# Patient Record
Sex: Female | Born: 1935 | Race: White | Hispanic: No | Smoking: Never smoker
Health system: Southern US, Community
[De-identification: ages and names within clinical notes are randomized; demographics above are authoritative.]

## PROBLEM LIST (undated history)

## (undated) DIAGNOSIS — C569 Malignant neoplasm of unspecified ovary: Secondary | ICD-10-CM

## (undated) DIAGNOSIS — K219 Gastro-esophageal reflux disease without esophagitis: Secondary | ICD-10-CM

## (undated) DIAGNOSIS — M1990 Unspecified osteoarthritis, unspecified site: Secondary | ICD-10-CM

## (undated) DIAGNOSIS — C189 Malignant neoplasm of colon, unspecified: Secondary | ICD-10-CM

## (undated) DIAGNOSIS — M797 Fibromyalgia: Secondary | ICD-10-CM

## (undated) DIAGNOSIS — I10 Essential (primary) hypertension: Secondary | ICD-10-CM

## (undated) DIAGNOSIS — F419 Anxiety disorder, unspecified: Secondary | ICD-10-CM

## (undated) DIAGNOSIS — F0390 Unspecified dementia without behavioral disturbance: Secondary | ICD-10-CM

## (undated) HISTORY — PX: COLON SURGERY: SHX602

## (undated) HISTORY — PX: ABDOMINAL HYSTERECTOMY: SHX81

---

## 1998-12-16 ENCOUNTER — Other Ambulatory Visit: Admission: RE | Admit: 1998-12-16 | Discharge: 1998-12-16 | Payer: Self-pay | Admitting: *Deleted

## 1998-12-19 ENCOUNTER — Ambulatory Visit (HOSPITAL_COMMUNITY): Admission: RE | Admit: 1998-12-19 | Discharge: 1998-12-19 | Payer: Self-pay | Admitting: *Deleted

## 2000-06-24 ENCOUNTER — Encounter: Payer: Self-pay | Admitting: *Deleted

## 2000-06-24 ENCOUNTER — Encounter: Admission: RE | Admit: 2000-06-24 | Discharge: 2000-06-24 | Payer: Self-pay | Admitting: *Deleted

## 2000-12-20 ENCOUNTER — Ambulatory Visit (HOSPITAL_COMMUNITY): Admission: RE | Admit: 2000-12-20 | Discharge: 2000-12-20 | Payer: Self-pay | Admitting: Gastroenterology

## 2001-02-09 ENCOUNTER — Emergency Department (HOSPITAL_COMMUNITY): Admission: EM | Admit: 2001-02-09 | Discharge: 2001-02-09 | Payer: Self-pay | Admitting: Emergency Medicine

## 2001-02-17 ENCOUNTER — Encounter: Admission: RE | Admit: 2001-02-17 | Discharge: 2001-02-17 | Payer: Self-pay | Admitting: Internal Medicine

## 2001-02-17 ENCOUNTER — Encounter: Payer: Self-pay | Admitting: Internal Medicine

## 2002-04-01 ENCOUNTER — Encounter: Admission: RE | Admit: 2002-04-01 | Discharge: 2002-04-01 | Payer: Self-pay | Admitting: Internal Medicine

## 2002-04-01 ENCOUNTER — Encounter: Payer: Self-pay | Admitting: Internal Medicine

## 2002-11-13 ENCOUNTER — Encounter: Payer: Self-pay | Admitting: Internal Medicine

## 2002-11-13 ENCOUNTER — Encounter: Admission: RE | Admit: 2002-11-13 | Discharge: 2002-11-13 | Payer: Self-pay | Admitting: Internal Medicine

## 2003-03-24 ENCOUNTER — Encounter: Admission: RE | Admit: 2003-03-24 | Discharge: 2003-03-24 | Payer: Self-pay | Admitting: Family Medicine

## 2003-03-24 ENCOUNTER — Encounter: Payer: Self-pay | Admitting: Family Medicine

## 2003-04-07 ENCOUNTER — Encounter: Payer: Self-pay | Admitting: Family Medicine

## 2003-04-07 ENCOUNTER — Encounter: Admission: RE | Admit: 2003-04-07 | Discharge: 2003-04-07 | Payer: Self-pay | Admitting: Family Medicine

## 2004-03-26 ENCOUNTER — Emergency Department (HOSPITAL_COMMUNITY): Admission: EM | Admit: 2004-03-26 | Discharge: 2004-03-27 | Payer: Self-pay | Admitting: Emergency Medicine

## 2004-04-19 ENCOUNTER — Ambulatory Visit (HOSPITAL_COMMUNITY): Admission: RE | Admit: 2004-04-19 | Discharge: 2004-04-19 | Payer: Self-pay | Admitting: Internal Medicine

## 2005-01-24 ENCOUNTER — Encounter: Admission: RE | Admit: 2005-01-24 | Discharge: 2005-01-24 | Payer: Self-pay | Admitting: Internal Medicine

## 2006-05-15 ENCOUNTER — Encounter: Admission: RE | Admit: 2006-05-15 | Discharge: 2006-05-15 | Payer: Self-pay | Admitting: Internal Medicine

## 2006-06-24 ENCOUNTER — Encounter: Admission: RE | Admit: 2006-06-24 | Discharge: 2006-06-24 | Payer: Self-pay | Admitting: Internal Medicine

## 2006-07-17 ENCOUNTER — Encounter: Admission: RE | Admit: 2006-07-17 | Discharge: 2006-07-17 | Payer: Self-pay | Admitting: Internal Medicine

## 2006-08-16 ENCOUNTER — Inpatient Hospital Stay (HOSPITAL_COMMUNITY): Admission: EM | Admit: 2006-08-16 | Discharge: 2006-08-20 | Payer: Self-pay | Admitting: Family Medicine

## 2006-08-19 ENCOUNTER — Encounter: Payer: Self-pay | Admitting: Vascular Surgery

## 2006-12-12 ENCOUNTER — Encounter: Admission: RE | Admit: 2006-12-12 | Discharge: 2006-12-12 | Payer: Self-pay | Admitting: Internal Medicine

## 2007-06-02 ENCOUNTER — Encounter: Admission: RE | Admit: 2007-06-02 | Discharge: 2007-06-02 | Payer: Self-pay | Admitting: Sports Medicine

## 2007-08-12 ENCOUNTER — Encounter: Admission: RE | Admit: 2007-08-12 | Discharge: 2007-08-12 | Payer: Self-pay | Admitting: Sports Medicine

## 2007-11-24 ENCOUNTER — Encounter: Admission: RE | Admit: 2007-11-24 | Discharge: 2007-11-24 | Payer: Self-pay | Admitting: Sports Medicine

## 2008-02-03 ENCOUNTER — Encounter: Admission: RE | Admit: 2008-02-03 | Discharge: 2008-02-03 | Payer: Self-pay | Admitting: Sports Medicine

## 2008-05-07 ENCOUNTER — Encounter: Admission: RE | Admit: 2008-05-07 | Discharge: 2008-05-07 | Payer: Self-pay | Admitting: Sports Medicine

## 2011-01-02 ENCOUNTER — Encounter: Admission: RE | Admit: 2011-01-02 | Payer: Self-pay | Source: Home / Self Care | Admitting: Internal Medicine

## 2011-05-18 NOTE — H&P (Signed)
NAME:  Beth, Garner NO.:  [id number]   MEDICAL RECORD NO.:  [id number]          Garner TYPE:  INP   LOCATION:  05-23-15                         FACILITY:  [facility]   PHYSICIAN:  Candyce Churn, M.D.DATE OF BIRTH:  06/08/1918   DATE OF ADMISSION:  08/16/2006  DATE OF DISCHARGE:                                HISTORY & PHYSICAL   CHIEF COMPLAINT:  Left chest pain.   HISTORY OF PRESENT ILLNESS:  [name] is a very pleasant 77 year old  female who developed left pleuritic chest pain last p.m. rather suddenly.  This persisted on inspiration this a.m. and she was seen at a medical walk-  in clinic where she was sent for a CT scan of her chest to evaluate for  pulmonary embolism and she indeed had 2 areas to suggest pulmonary emboli on  chest CT.   She has had a prior pulmonary embolism in Beth distant past and is being  admitted now for intravenous therapy, heparin, observation and monitoring,  and Coumadin therapy.   She has not been eating well over Beth past 24 hours secondary to Beth pain.   ALLERGIES:  NOVOCAIN. CODEINE also causes nausea. CIPROFLOXACIN has caused  nausea and vomiting as well.   PAST MEDICAL HISTORY:  1. Peptic ulcer disease distant past.  2. Asthma.  3. Hypertension.  4. Peripheral neuropathy.  5. History of colon cancer.  6. History of ovarian cancer.  7. Degenerative joint disease.  8. Cholelithiasis.  9. Pulmonary fibrosis.  10.GERD.  11.Hiatal hernia.  12.Osteoarthritis.   CURRENT MEDICATIONS:  1. Spiriva inhaler 1 capsule inhaled daily.  2. Lisinopril HCT 10/12.5 1 tablet daily.  3. Tylenol 650 mg p.o. q.4 h p.r.n.  4. Multivitamin 1 daily.  5. Pepcid or Zantac b.i.d. p.r.n.   PAST SURGICAL HISTORY:  1. Right hemicolectomy in 1983/05/23.  2. Left oophorectomy.  3. TAH/BSO and right oophorectomy at a different time.   FAMILY HISTORY:  Father died of lung cancer in 05/22/73, mother died of rectal  cancer. Brother possibly died of  colon cancer. She has an adopted daughter  who is healthy.   SOCIAL HISTORY:  [name] is a widow and lives alone in [place]. She  is retired from working in [employer] as well as working at [employer]. No alcohol or tobacco use. Does drink coffee.   REVIEW OF SYSTEMS:  Feeling well until yesterday as per HPI. No recent  changes in bowel habits or urinary habits.   PHYSICAL EXAMINATION:  GENERAL:  Elderly female complaining of left  pleuritic chest pain. Alert and oriented, very pleasant and cooperative.  VITAL SIGNS:  Temperature 98.3, pulse 88 and regular, respiratory rate 20  and shallow secondary to left pleuritic chest pain. Blood pressure 98/52, O2  saturation on room air is 96% but this may have actually been with some  oxygen per nasal cannula.  HEENT:  Benign.  NECK:  Supple without JVD or thyromegaly.  CHEST:  Clear to auscultation.  CARDIAC:  Reveals a regular rhythm with no murmurs.  ABDOMEN:  Soft, nontender, no  obvious hepatomegaly, bowel sounds are normal.  PELVIC:  Not performed.  EXTREMITIES:  Without cyanosis, clubbing or edema. Does have some  deformities of her toes. Good capillary refill distally.  NEUROLOGIC:  Nonfocal.  SKIN:  Warm and dry.   [name] did bring a CD of her chest CT from [facility] but I  could not open it on Beth monitor in Beth emergency room.   ASSESSMENT:  An 77 year old female with pulmonary embolus - she has had one  in Beth very distant past by history.   Will admit and monitor, start Lovenox 1 mg/kg q.12 h and also start  Coumadin, first dose of 10 mg tonight and then as per pharmacy dosing over  Beth next several days.   [name] would like to go home if at all possible from this  hospitalization and may do well with home nursing until Coumadin is  therapeutic.   Will treat with nasal cannula O2 and monitor until it is found that she is  clinically stable.   I should mention that her  laboratories reveal a hemoglobin of 11.6, platelet  count 191,000 with 73% neutrophils. Protime was 14.6 seconds, PTT is 27  seconds, INR is 1.1. Electrolytes revealed a sodium of 137, potassium 4.1,  chloride 104, bicarb 26, glucose 109, BUN 19, creatinine 1.1, calcium is  9.5. Creatinine kinase is 44, CK-MB is 1.1 and troponin is 0.02. Urinalysis  reveals a specific gravity as high as 1.046, pH is 6, urine and glucose is  negative and other parameters are negative for abnormality on urinalysis.   EKG is pending and chest x-ray reveals left base atelectasis or infiltrate  quite possibly consistent with pulmonary infarct.   Will also add Toradol 30 mg IV q.6-8 h as needed for left chest pain because  of a possible pulmonary infarct.   I should also mention Beth chest x-ray revealed underlying emphysema was  suspected with borderline cardiomegaly.      Candyce Churn, M.D.  Electronically Signed     RNG/MEDQ  D:  08/16/2006  T:  08/16/2006  Job:  [id number]   cc:   Georgann Housekeeper, MD

## 2011-05-18 NOTE — Discharge Summary (Signed)
[name], SAULTER                ACCOUNT NO.:  [id number]   MEDICAL RECORD NO.:  [id number]          PATIENT TYPE:  INP   LOCATION:  2016                         FACILITY:  [facility]   PHYSICIAN:  Georgann Housekeeper, MD      DATE OF BIRTH:  May 15, 1918   DATE OF ADMISSION:  08/16/2006  DATE OF DISCHARGE:  08/20/2006                                 DISCHARGE SUMMARY   DISCHARGE DIAGNOSES:  1. Acute pulmonary embolism.  2. Anemia.  3. Mild renal insufficiency, resolved.  4. History of hypertension,  5. History of asthma and mild pulmonary fibrosis.  6. History of colon cancer.   MEDICATIONS ON DISCHARGE:  1. Spiriva inhaler once a day.  2. Coumadin 2 mg as directed.  3. Pepcid 20 mg q.day p.r.n.  4. Lisinopril, hydrochlorothiazide 10/12.5 (on hold).  Diet Coumadin diet.      No NSAIDs or aspirin use.  5. Tylenol p.r.n. for pain.   PROCEDURES:  CT of the chest showed a small PE on the left lung lobe with  some left pleuritic pain noted.  Doppler of the lower extremity negative for  DVT.  As far as the EKG, normal sinus rhythm.   LAB DATA:  Significant on admission.  The creatinine was 1.7.  Normal white  count.  Normal electrolytes and hemoglobin of 11.0.   HOSPITAL COURSE:  1. The patient is an 77 year old female with a history of asthma, mild      pulmonary fibrosis, hypertension, colon CA, degenerative joint disease,      in usual state of health, started having left pleuritic chest pain      suddenly on inspiration.  Went to the walk-in clinic.  Had suspicious      for PE.  Was sent for a CT scan, which showed a pulmonary embolism on      the left and also a very small one on the right.  Hemodynamically,      patient was stable.  Patient was admitted with anticoagulation with      Lovenox subcu, as well as started on Coumadin.  For pleuritic chest      pain, the patient had Toradol with good results of the pain control.      Required for only 24 hours.  Her oxygen remained stable  in saturations      between 93% to 96% on room air.  As far as her Coumadin level, she      received subsequently ten 5 mg of Coumadin.  INR went from 1.2 to 2.3      and subsequently 3.6.  Coumadin was given 1 mg.  INR remained at 3.1.      At the time of discharge, Lovenox was discontinued.  No evidence of any      bleeding.  2. History of mild anemia.  Hemoglobin dropped from 11.0 to 9.4, which the      last 3 days remained stable on the hemoglobin of 9.0.  No evidence of      any bleeding.  3. Hypertension.  Patient had low blood pressures in  the systolics of 100.      Lisinopril, hydrochlorothiazide was held.  Will continue monitoring      that outpatient.  To restart back.  4. As far as renal insufficiency, creatinine 1.7, more mild dehydration      resolved.  Creatinine went down to 1.3 at      time of discharge.  Electrolytes remain normal, along with potassium      and her sodium.  5. As far as the Doppler of the lower extremity, negative for DVT.  At the      time of discharge, INR was 3.1.  We will adjust that in the office.  I      will see her back in one week.  She already has an appointment.      Georgann Housekeeper, MD  Electronically Signed     KH/MEDQ  D:  08/20/2006  T:  08/20/2006  Job:  [id number]

## 2011-06-27 ENCOUNTER — Inpatient Hospital Stay (HOSPITAL_COMMUNITY)
Admission: EM | Admit: 2011-06-27 | Discharge: 2011-06-30 | DRG: 149 | Disposition: A | Discharge status: Skilled Nursing Facility | Payer: Medicare Other | Attending: Internal Medicine | Admitting: Internal Medicine

## 2011-06-27 ENCOUNTER — Emergency Department (HOSPITAL_COMMUNITY): Payer: Medicare Other

## 2011-06-27 DIAGNOSIS — R627 Adult failure to thrive: Secondary | ICD-10-CM | POA: Diagnosis present

## 2011-06-27 DIAGNOSIS — G609 Hereditary and idiopathic neuropathy, unspecified: Secondary | ICD-10-CM | POA: Diagnosis present

## 2011-06-27 DIAGNOSIS — K219 Gastro-esophageal reflux disease without esophagitis: Secondary | ICD-10-CM | POA: Diagnosis present

## 2011-06-27 DIAGNOSIS — E869 Volume depletion, unspecified: Secondary | ICD-10-CM | POA: Diagnosis present

## 2011-06-27 DIAGNOSIS — F411 Generalized anxiety disorder: Secondary | ICD-10-CM | POA: Diagnosis present

## 2011-06-27 DIAGNOSIS — Z86718 Personal history of other venous thrombosis and embolism: Secondary | ICD-10-CM

## 2011-06-27 DIAGNOSIS — I129 Hypertensive chronic kidney disease with stage 1 through stage 4 chronic kidney disease, or unspecified chronic kidney disease: Secondary | ICD-10-CM | POA: Diagnosis present

## 2011-06-27 DIAGNOSIS — N183 Chronic kidney disease, stage 3 unspecified: Secondary | ICD-10-CM | POA: Diagnosis present

## 2011-06-27 DIAGNOSIS — M1990 Unspecified osteoarthritis, unspecified site: Secondary | ICD-10-CM | POA: Diagnosis present

## 2011-06-27 DIAGNOSIS — J8410 Pulmonary fibrosis, unspecified: Secondary | ICD-10-CM | POA: Diagnosis present

## 2011-06-27 DIAGNOSIS — Z79899 Other long term (current) drug therapy: Secondary | ICD-10-CM

## 2011-06-27 DIAGNOSIS — R269 Unspecified abnormalities of gait and mobility: Secondary | ICD-10-CM | POA: Diagnosis present

## 2011-06-27 DIAGNOSIS — D638 Anemia in other chronic diseases classified elsewhere: Secondary | ICD-10-CM | POA: Diagnosis present

## 2011-06-27 DIAGNOSIS — Z8543 Personal history of malignant neoplasm of ovary: Secondary | ICD-10-CM

## 2011-06-27 DIAGNOSIS — H8110 Benign paroxysmal vertigo, unspecified ear: Principal | ICD-10-CM | POA: Diagnosis present

## 2011-06-27 LAB — URINALYSIS, ROUTINE W REFLEX MICROSCOPIC
Bilirubin Urine: NEGATIVE
Ketones, ur: NEGATIVE mg/dL
Nitrite: NEGATIVE
pH: 8 (ref 5.0–8.0)

## 2011-06-27 LAB — LIPASE, BLOOD: Lipase: 55 U/L (ref 11–59)

## 2011-06-27 LAB — CBC
HCT: 36.6 % (ref 36.0–46.0)
Hemoglobin: 11.6 g/dL — ABNORMAL LOW (ref 12.0–15.0)
RBC: 4 MIL/uL (ref 3.87–5.11)
WBC: 4.7 10*3/uL (ref 4.0–10.5)

## 2011-06-27 LAB — DIFFERENTIAL
Basophils Absolute: 0 10*3/uL (ref 0.0–0.1)
Lymphocytes Relative: 24 % (ref 12–46)
Neutro Abs: 3.4 10*3/uL (ref 1.7–7.7)

## 2011-06-27 LAB — CK TOTAL AND CKMB (NOT AT ARMC)
CK, MB: 3.8 ng/mL (ref 0.3–4.0)
Relative Index: INVALID (ref 0.0–2.5)
Total CK: 77 U/L (ref 7–177)

## 2011-06-27 LAB — COMPREHENSIVE METABOLIC PANEL
Albumin: 3.7 g/dL (ref 3.5–5.2)
Alkaline Phosphatase: 92 U/L (ref 39–117)
BUN: 28 mg/dL — ABNORMAL HIGH (ref 6–23)
Chloride: 102 mEq/L (ref 96–112)
GFR calc Af Amer: 46 mL/min — ABNORMAL LOW (ref >60)
Glucose, Bld: 101 mg/dL — ABNORMAL HIGH (ref 70–99)
Potassium: 3.8 mEq/L (ref 3.5–5.1)
Total Bilirubin: 0.7 mg/dL (ref 0.3–1.2)

## 2011-06-27 LAB — URINE MICROSCOPIC-ADD ON

## 2011-06-28 LAB — CBC
Hemoglobin: 9.9 g/dL — ABNORMAL LOW (ref 12.0–15.0)
MCHC: 32.8 g/dL (ref 30.0–36.0)
Platelets: 144 10*3/uL — ABNORMAL LOW (ref 150–400)
RDW: 12.7 % (ref 11.5–15.5)

## 2011-06-28 LAB — CK TOTAL AND CKMB (NOT AT ARMC)
CK, MB: 2.8 ng/mL (ref 0.3–4.0)
CK, MB: 4.9 ng/mL — ABNORMAL HIGH (ref 0.3–4.0)
Relative Index: INVALID (ref 0.0–2.5)
Relative Index: 3.6 — ABNORMAL HIGH (ref 0.0–2.5)
Total CK: 68 U/L (ref 7–177)
Total CK: 136 U/L (ref 7–177)

## 2011-06-28 LAB — BASIC METABOLIC PANEL
GFR calc Af Amer: 41 mL/min — ABNORMAL LOW (ref >60)
GFR calc non Af Amer: 34 mL/min — ABNORMAL LOW (ref >60)
Potassium: 3.9 mEq/L (ref 3.5–5.1)
Sodium: 140 mEq/L (ref 135–145)

## 2011-06-28 LAB — TROPONIN I
Troponin I: 0.32 ng/mL (ref <0.30)
Troponin I: <0.3 ng/mL (ref <0.30)
Troponin I: <0.3 ng/mL (ref <0.30)

## 2011-06-29 LAB — TROPONIN I: Troponin I: <0.3 ng/mL (ref <0.30)

## 2011-06-29 LAB — CK TOTAL AND CKMB (NOT AT ARMC)
CK, MB: 3.8 ng/mL (ref 0.3–4.0)
CK, MB: 4.1 ng/mL — ABNORMAL HIGH (ref 0.3–4.0)
Relative Index: 2.9 — ABNORMAL HIGH (ref 0.0–2.5)
Relative Index: 3 — ABNORMAL HIGH (ref 0.0–2.5)

## 2011-07-15 NOTE — H&P (Signed)
[name], [name]                ACCOUNT NO.:  [id number]  MEDICAL RECORD NO.:  [id number]  LOCATION:  1302                         FACILITY:  [facility]  PHYSICIAN:  Kela Millin, M.D.DATE OF BIRTH:  1918/06/10  DATE OF ADMISSION:  06/27/2011 DATE OF DISCHARGE:                             HISTORY & PHYSICAL   PRIMARY CARE PHYSICIAN:  Georgann Housekeeper, MD  CHIEF COMPLAINT:  Nausea and dizziness.  HISTORY OF PRESENT ILLNESS:  The patient is a 77 year old white female with history of hypertension; peripheral neuropathy; anemia; GERD; osteoarthritis/degenerative joint disease; peptic ulcer disease; history of colon cancer, status post right hemicolectomy in 1984; and also per her report has had vertigo in the past, and she presents with the above complaints.  She states that she has had nausea for a few weeks but it got worse over the past couple of days and she was unable to eat anything today.  She also reports that she began having dizziness, described as the room spinning/"a wobbly feeling."  She states that she went to her primary care physician's office yesterday, 6/26, for a regularly scheduled followup appointment and she was told that she was dehydrated and so was asked to stop taking her blood pressure pill - Zestoretic, which she did.  She denies focal weakness, blurry vision, dysphagia, diarrhea, abdominal pain, melena, dysuria, and no hematochezia.  She admits to intermittent headaches.  She states that because of her persistent nausea and the dizziness, she decided to come to the ER.  In the ER, a urinalysis was done and came back negative.  An MRI of her brain showed no acute intracranial abnormalities.  And her chemistries revealed a BUN of 28 with a creatinine of 1.32.  She was admitted for further evaluation and management.  PAST MEDICAL HISTORY: 1. As above. 2. History of anemia. 3. History of PE in 2007. 4. Asthma with mild pulmonary fibrosis. 5.  Peripheral neuropathy. 6. History of ovarian cancer, status post left oophorectomy as well as     total abdominal hysterectomy with right oophorectomy in the past. 7. History of cholelithiasis. 8. Hiatal hernia.  MEDICATIONS: 1. Tramadol 50 mg b.i.d. p.r.n. 2. Vitamin D3 over the counter. 3. Alprazolam 0.25 mg 1 b.i.d. 4. She stopped taking Zestoretic yesterday, as above.  ALLERGIES:  No known drug allergies.  SOCIAL HISTORY:  She lives alone.  She denies tobacco.  She denies alcohol, reports secondhand smoke exposure.  FAMILY HISTORY:  Her mother died of rectal cancer.  Father died of lung cancer.  REVIEW OF SYSTEMS:  As per HPI, other review of systems negative.  PHYSICAL EXAM:  GENERAL:  The patient is a pleasant elderly white female.  She is alert and oriented in no apparent distress. VITAL SIGNS:  Her temperature is 97.5, blood pressure is 134/69, pulse of 86, respiratory rate of 20, O2 sat of 95% on room air. HEENT:  PERRLA, EOMI.  Dry mucous membranes, no oral exudates. NECK:  Supple.  No adenopathy, no thyromegaly and no JVD. LUNGS:  Decreased breath sounds at bases, no wheezes. CARDIOVASCULAR:  Regular rate and rhythm.  Normal S1, S2.  No S4 appreciated. ABDOMEN:  Soft, bowel sounds  present, nontender, nondistended.  No organomegaly and no masses palpable. EXTREMITIES:  No cyanosis and no edema. NEURO:  Alert and oriented x3.  Cranial nerves II-XII grossly intact. Nonfocal exam.  LABORATORY DATA:  As per HPI.  Also, her white cell count is 4.7 with a hemoglobin of 11.6, hematocrit of 36.6, platelet count 163, neutrophil count of 72%.  Sodium is 138 with a potassium of 3.8, chloride 102, CO2 of 27, glucose 101, BUN 28, creatinine 1.32 (last creatinine per E-chart records 1.3 in 2007). Lipase is 55 and urinalysis is negative for infection. MRI:  Negative for acute findings.  ASSESSMENT AND PLAN: 1. Dizziness/vertigo:  MRI is negative for acute infarcts.  Will      obtain cardiac enzymes and follow.  The patient reports prior     history of vertigo, will place on Antivert p.r.n. and follow.  Also     consult PT, OT. 2. Nausea, likely secondary to above.  Her lipase is within normal     limits and urinalysis is negative for infection.  Cardiac enzymes     as above, follow. 3. Volume depletion/azotemia, likely prerenal:  We will also continue     holding Zestoretic for now.  Hydrate, follow and recheck. 4. History of gastroesophageal reflux disease/hiatal hernia:  Will     place on proton-pump inhibitor. 5. History of peptic ulcer disease:  Placed on a proton-pump     inhibitor. 6. History of peripheral neuropathy:  Continue p.r.n. tramadol. 7. Normocytic anemia:  Follow, recheck, and check stool guaiacs.  The     patient also with a history of gastroesophageal reflux disease and     this could be a contributing factor (on no medications).     Kela Millin, M.D.     ACV/MEDQ  D:  06/27/2011  T:  06/27/2011  Job:  [id number]  cc:   Georgann Housekeeper, MD Fax: [phone]  Electronically Signed by Donnalee Curry M.D. on 07/15/2011 07:40:37 AM

## 2011-07-17 NOTE — Discharge Summary (Signed)
  NAME:  [name], [name]                ACCOUNT NO.:  [id number]  MEDICAL RECORD NO.:  [id number]  LOCATION:  1302                         FACILITY:  [facility]  PHYSICIAN:  Georgann Housekeeper, MD      DATE OF BIRTH:  May 19, 1918  DATE OF ADMISSION:  06/27/2011 DATE OF DISCHARGE:                              DISCHARGE SUMMARY   POSSIBLE DATE OF DISCHARGE:  06/29/2011  DISCHARGE DIAGNOSES:  Nausea, vomiting, dizziness, benign positional vertigo, gait disorder, failure to thrive, history of hypertension, history of chronic kidney disease stage 3 with creatinine 1.8 at baseline, history of anemia of chronic disease, history of pulmonary embolism in 2007, history of mild pulmonary fibrosis, history of peripheral neuropathy, history of ovarian cancer status post oophorectomy and total abdominal hysterectomy, history of gallstones, history of hiatal hernia, osteoarthritis.  MEDICATION ON DISCHARGE:  Zofran 4 mg q.6 p.r.n., meclizine 25 mg half a tablet q.8 p.r.n., Xanax 0.25 mg p.o. b.i.d., tramadol 50 mg b.i.d. p.r.n., vitamin D 1000 units once daily.  ALLERGIES:  None.  SOCIAL HISTORY:  Lives alone.  She has a daughter.  LABORATORY DATA:  Her blood chemistries showed creatinine 1.4.  Sodium 140, potassium 3.9.  CBC showed hemoglobin 9.9, white count of 6.0. Cardiac markers negative.  UA was negative.  MRI of the brain negative.  HOSPITAL COURSE:  This is a 77 year old female admitted with episode of nausea, vomiting and dizziness with unstable gait. 1. The patient had workup including MRI of the brain which was     negative for any acute stroke or aneurysm.  The patient has got     some IV fluids.  Her dizziness and nausea improved.  She still has     unsteady gait.  Physical Therapy was consulted, evaluated and     suggested.  She needs skilled facility for acute rehab since the     patient lives alone and risk for fall and gait disorder as well as     weakness.  Her blood pressure  medication was held.  She was on     Zestoretic 10/12.5 mg daily.  Blood pressure remained stable. 2. Hypertension.  The patient's blood pressure medication Zestoretic     10/12.5 was on hold.  If blood pressure exceed greater than 140,     she will need to be started back on her previous blood pressure     medication. 3. History of chronic kidney disease, creatinine baseline 1.5 to 1.8     at outpatient.  The patient's creatinine remained stable. 4. Anemia of chronic disease, stable. 5. History of osteoarthritis.  Tramadol p.r.n. 6. History of pulmonary fibrosis, asymptomatic.  No need for inhalers. 7. As far as her GERD, no medications. 8. History of anxiety. Xanax b.i.d. 9. The patient will need physical therapy and occupational therapy and     skilled care.  The patient will be discharged to skilled rehab facility.     Georgann Housekeeper, MD     KH/MEDQ  D:  06/28/2011  T:  06/28/2011  Job:  [id number]  Electronically Signed by Georgann Housekeeper MD on 07/17/2011 10:35:16 PM

## 2012-07-04 ENCOUNTER — Emergency Department (HOSPITAL_COMMUNITY): Payer: Medicare Other

## 2012-07-04 ENCOUNTER — Encounter (HOSPITAL_COMMUNITY): Payer: Self-pay

## 2012-07-04 ENCOUNTER — Emergency Department (HOSPITAL_COMMUNITY)
Admission: EM | Admit: 2012-07-04 | Discharge: 2012-07-04 | Disposition: A | Discharge status: Home / Self Care | Payer: Medicare Other | Attending: Emergency Medicine | Admitting: Emergency Medicine

## 2012-07-04 DIAGNOSIS — M25559 Pain in unspecified hip: Secondary | ICD-10-CM | POA: Insufficient documentation

## 2012-07-04 DIAGNOSIS — M899 Disorder of bone, unspecified: Secondary | ICD-10-CM | POA: Insufficient documentation

## 2012-07-04 HISTORY — DX: Anxiety disorder, unspecified: F41.9

## 2012-07-04 HISTORY — DX: Unspecified osteoarthritis, unspecified site: M19.90

## 2012-07-04 MED ORDER — HYDROCODONE-ACETAMINOPHEN 5-325 MG PO TABS
1.0000 | ORAL_TABLET | Freq: Once | ORAL | Status: AC
  Administered 2012-07-04: 1 via ORAL
  Filled 2012-07-04: qty 1

## 2012-07-04 MED ORDER — HYDROCODONE-ACETAMINOPHEN 5-325 MG PO TABS: ORAL_TABLET | ORAL | Status: DC

## 2012-07-04 MED ORDER — CELECOXIB 100 MG PO CAPS: ORAL_CAPSULE | ORAL | Status: DC

## 2012-07-04 NOTE — ED Provider Notes (Signed)
History     CSN: [id number]  Arrival date & time 07/04/12  1404   First MD Initiated Contact with Patient 07/04/12 1504      Chief Complaint  Patient presents with  . Hip Pain    (Consider location/radiation/quality/duration/timing/severity/associated sxs/prior treatment) Patient is a 78 y.o. female presenting with hip pain. The history is provided by the patient (hip pain.  the pt states she has had right hip pain for years.  worse now). No language interpreter was used.  Hip Pain This is a chronic problem. The current episode started more than 1 week ago. The problem occurs constantly. The problem has been gradually worsening. Pertinent negatives include no chest pain, no abdominal pain and no headaches. The symptoms are aggravated by twisting. Nothing relieves the symptoms. She has tried acetaminophen for the symptoms. The treatment provided moderate relief.    Past Medical History  Diagnosis Date  . Arthritis   . Anxiety     No past surgical history on file.  No family history on file.  History  Substance Use Topics  . Smoking status: Not on file  . Smokeless tobacco: Not on file  . Alcohol Use:     OB History    Grav Para Term Preterm Abortions TAB SAB Ect Mult Living                  Review of Systems  Constitutional: Negative for fatigue.  HENT: Negative for congestion, sinus pressure and ear discharge.   Eyes: Negative for discharge.  Respiratory: Negative for cough.   Cardiovascular: Negative for chest pain.  Gastrointestinal: Negative for abdominal pain and diarrhea.  Genitourinary: Negative for frequency and hematuria.  Musculoskeletal: Negative for back pain.       Pain in right hip  Skin: Negative for rash.  Neurological: Negative for seizures and headaches.  Hematological: Negative.   Psychiatric/Behavioral: Negative for hallucinations.    Allergies  Review of patient's allergies indicates no known allergies.  Home Medications   Current  Outpatient Rx  Name Route Sig Dispense Refill  . ACETAMINOPHEN 500 MG PO TABS Oral Take 1,000 mg by mouth every 6 (six) hours as needed. For pain    . ALPRAZOLAM 0.25 MG PO TABS Oral Take 0.25 mg by mouth 2 (two) times daily as needed. For nerves    . MENTHOL (TOPICAL ANALGESIC) 1.4 % EX PTCH Apply externally Apply 1 patch topically at bedtime as needed. Apply to hip at bedtime as needed for pain    . CELECOXIB 100 MG PO CAPS  Take one pill twice a day for pain 60 capsule 1  . HYDROCODONE-ACETAMINOPHEN 5-325 MG PO TABS  Take one every 6 hours for pain not helped by celebrex or tylenol 40 tablet 0    BP 147/78  Pulse 79  Temp 97.9 F (36.6 C) (Oral)  Resp 20  SpO2 99%  Physical Exam  Constitutional: She is oriented to person, place, and time. She appears well-developed.  HENT:  Head: Normocephalic and atraumatic.  Eyes: Conjunctivae and EOM are normal. No scleral icterus.  Neck: Neck supple. No thyromegaly present.  Cardiovascular: Normal rate and regular rhythm.  Exam reveals no gallop and no friction rub.   No murmur heard. Pulmonary/Chest: No stridor. She has no wheezes. She has no rales. She exhibits no tenderness.  Abdominal: She exhibits no distension. There is no tenderness. There is no rebound.  Musculoskeletal: Normal range of motion.       Tender right hip  Lymphadenopathy:    She has no cervical adenopathy.  Neurological: She is oriented to person, place, and time. Coordination normal.  Skin: No rash noted. No erythema.  Psychiatric: She has a normal mood and affect. Her behavior is normal.    ED Course  Procedures (including critical care time)  Labs Reviewed - No data to display Dg Hip Complete Right  07/04/2012  *RADIOLOGY REPORT*  Clinical Data: Right hip pain  RIGHT HIP - COMPLETE 2+ VIEW  Comparison: 04/30/2012  Findings: Three views of the right hip submitted.  There is diffuse osteopenia. Extensive degenerative changes right hip joint again noted.  Again noted  collapsed and remodeling of the right femoral head.  Subchondral sclerosis noted superior aspect of femoral head and right acetabulum. No acute fracture is noted.  Mild levoscoliosis and degenerative changes lumbar spine.  Again noted superior shift of the femoral head and widening of the right acetabulum.  IMPRESSION: Diffuse osteopenia.  Extensive degenerative changes right hip joint again noted.  Again noted collapsed and remodeled femoral head. Subchondral sclerosis superior aspect of the acetabulum and right femoral head.  Again noted superolaterally shift of femoral head and widening of the acetabulum.  Degenerative changes and mild levoscoliosis lumbar spine. No acute fracture.  Original Report Authenticated By: Natasha Mead, M.D.     1. Hip pain       MDM          Benny Lennert, MD 07/04/12 [phone]

## 2012-07-04 NOTE — ED Notes (Signed)
ZOX:[id number]<AV> Expected date:07/04/12<BR> Expected time: 1:46 PM<BR> Means of arrival:Ambulance<BR> Comments:<BR> 89yoF, R hip pain

## 2012-07-04 NOTE — ED Notes (Signed)
Pt from home, lives alone.  Has chronic RT hip pain, worse today.  Has a neighbor that checks on her. She has increased pain today and unable to ambulate w/her walker today.  There was no trama or fall.  Has been taking tylenol, but not helping today.  Has been evaluated by ortho but "past the age" for surgery.

## 2014-02-22 ENCOUNTER — Ambulatory Visit (INDEPENDENT_AMBULATORY_CARE_PROVIDER_SITE_OTHER): Payer: Medicare Other | Admitting: Podiatry

## 2014-02-22 ENCOUNTER — Ambulatory Visit: Payer: Self-pay | Admitting: Podiatry

## 2014-02-22 ENCOUNTER — Encounter: Payer: Self-pay | Admitting: Podiatry

## 2014-02-22 VITALS — BP 102/65 | HR 70 | Resp 12

## 2014-02-22 DIAGNOSIS — M79609 Pain in unspecified limb: Secondary | ICD-10-CM

## 2014-02-22 DIAGNOSIS — B351 Tinea unguium: Secondary | ICD-10-CM

## 2014-02-22 NOTE — Progress Notes (Signed)
   Subjective:    Patient ID: [name], female    DOB: Apr 14, 1918, 80 y.o.   MRN: [id number]  HPI I am here to get my toenails trimmed up. This patient was seen on 06/08/2013 for similar service.  Medical history includes osteoarthritis, GERD, peripheral neuropathy, chronic kidney disease stage III, hiatal hernia, asymptomatic gallstones, BPV, osteoporosis, lumbar degenerative disease, gait disorder. These were noted on medical record from [place] dated 05/22/2013.   Review of Systems  All other systems reviewed and are negative.       Objective:   Physical Exam Brittle, hypertrophic, discolored, incurvated toenails x10 ,with palpable tenderness in the toenails.        Assessment & Plan:   Assessment: Symptomatic onychomycoses x10  Plan: Nails x10 debrided back without a bleeding. Reappoint x3 months with DR. Regal.

## 2014-05-17 ENCOUNTER — Ambulatory Visit: Payer: Medicare Other | Admitting: Podiatry

## 2014-11-30 ENCOUNTER — Other Ambulatory Visit: Payer: Self-pay | Admitting: Internal Medicine

## 2014-11-30 ENCOUNTER — Ambulatory Visit
Admission: RE | Admit: 2014-11-30 | Discharge: 2014-11-30 | Disposition: A | Discharge status: Home / Self Care | Payer: Medicare Other | Source: Ambulatory Visit | Attending: Internal Medicine | Admitting: Internal Medicine

## 2014-11-30 DIAGNOSIS — R7611 Nonspecific reaction to tuberculin skin test without active tuberculosis: Secondary | ICD-10-CM

## 2015-01-23 ENCOUNTER — Observation Stay (HOSPITAL_COMMUNITY): Payer: Medicare Other

## 2015-01-23 ENCOUNTER — Inpatient Hospital Stay (HOSPITAL_COMMUNITY): Payer: Medicare Other

## 2015-01-23 ENCOUNTER — Emergency Department (HOSPITAL_COMMUNITY): Payer: Medicare Other

## 2015-01-23 ENCOUNTER — Inpatient Hospital Stay (HOSPITAL_COMMUNITY)
Admission: EM | Admit: 2015-01-23 | Discharge: 2015-01-26 | DRG: 202 | Disposition: A | Discharge status: Hospice | Payer: Medicare Other | Attending: Internal Medicine | Admitting: Internal Medicine

## 2015-01-23 ENCOUNTER — Encounter (HOSPITAL_COMMUNITY): Payer: Self-pay | Admitting: Nurse Practitioner

## 2015-01-23 DIAGNOSIS — W19XXXA Unspecified fall, initial encounter: Secondary | ICD-10-CM

## 2015-01-23 DIAGNOSIS — Z85038 Personal history of other malignant neoplasm of large intestine: Secondary | ICD-10-CM

## 2015-01-23 DIAGNOSIS — R05 Cough: Secondary | ICD-10-CM

## 2015-01-23 DIAGNOSIS — J209 Acute bronchitis, unspecified: Secondary | ICD-10-CM | POA: Diagnosis not present

## 2015-01-23 DIAGNOSIS — R0602 Shortness of breath: Secondary | ICD-10-CM

## 2015-01-23 DIAGNOSIS — I2699 Other pulmonary embolism without acute cor pulmonale: Secondary | ICD-10-CM | POA: Diagnosis present

## 2015-01-23 DIAGNOSIS — Z8543 Personal history of malignant neoplasm of ovary: Secondary | ICD-10-CM

## 2015-01-23 DIAGNOSIS — H109 Unspecified conjunctivitis: Secondary | ICD-10-CM | POA: Diagnosis present

## 2015-01-23 DIAGNOSIS — R778 Other specified abnormalities of plasma proteins: Secondary | ICD-10-CM | POA: Diagnosis present

## 2015-01-23 DIAGNOSIS — R531 Weakness: Secondary | ICD-10-CM

## 2015-01-23 DIAGNOSIS — E43 Unspecified severe protein-calorie malnutrition: Secondary | ICD-10-CM | POA: Diagnosis present

## 2015-01-23 DIAGNOSIS — Z66 Do not resuscitate: Secondary | ICD-10-CM | POA: Diagnosis present

## 2015-01-23 DIAGNOSIS — N183 Chronic kidney disease, stage 3 unspecified: Secondary | ICD-10-CM | POA: Diagnosis present

## 2015-01-23 DIAGNOSIS — D631 Anemia in chronic kidney disease: Secondary | ICD-10-CM | POA: Diagnosis present

## 2015-01-23 DIAGNOSIS — F419 Anxiety disorder, unspecified: Secondary | ICD-10-CM | POA: Diagnosis present

## 2015-01-23 DIAGNOSIS — R7989 Other specified abnormal findings of blood chemistry: Secondary | ICD-10-CM

## 2015-01-23 DIAGNOSIS — D649 Anemia, unspecified: Secondary | ICD-10-CM | POA: Diagnosis present

## 2015-01-23 DIAGNOSIS — Z9181 History of falling: Secondary | ICD-10-CM

## 2015-01-23 DIAGNOSIS — R059 Cough, unspecified: Secondary | ICD-10-CM | POA: Insufficient documentation

## 2015-01-23 DIAGNOSIS — M1610 Unilateral primary osteoarthritis, unspecified hip: Secondary | ICD-10-CM | POA: Diagnosis present

## 2015-01-23 DIAGNOSIS — N390 Urinary tract infection, site not specified: Secondary | ICD-10-CM | POA: Diagnosis present

## 2015-01-23 DIAGNOSIS — J9601 Acute respiratory failure with hypoxia: Secondary | ICD-10-CM | POA: Diagnosis present

## 2015-01-23 DIAGNOSIS — R627 Adult failure to thrive: Secondary | ICD-10-CM | POA: Diagnosis present

## 2015-01-23 DIAGNOSIS — G9340 Encephalopathy, unspecified: Secondary | ICD-10-CM | POA: Diagnosis present

## 2015-01-23 DIAGNOSIS — R0902 Hypoxemia: Secondary | ICD-10-CM

## 2015-01-23 DIAGNOSIS — M797 Fibromyalgia: Secondary | ICD-10-CM | POA: Diagnosis present

## 2015-01-23 DIAGNOSIS — Z515 Encounter for palliative care: Secondary | ICD-10-CM

## 2015-01-23 DIAGNOSIS — I447 Left bundle-branch block, unspecified: Secondary | ICD-10-CM | POA: Diagnosis present

## 2015-01-23 DIAGNOSIS — I129 Hypertensive chronic kidney disease with stage 1 through stage 4 chronic kidney disease, or unspecified chronic kidney disease: Secondary | ICD-10-CM | POA: Diagnosis present

## 2015-01-23 DIAGNOSIS — N179 Acute kidney failure, unspecified: Secondary | ICD-10-CM | POA: Diagnosis present

## 2015-01-23 DIAGNOSIS — Z79899 Other long term (current) drug therapy: Secondary | ICD-10-CM

## 2015-01-23 DIAGNOSIS — Z7401 Bed confinement status: Secondary | ICD-10-CM

## 2015-01-23 HISTORY — DX: Malignant neoplasm of unspecified ovary: C56.9

## 2015-01-23 HISTORY — DX: Fibromyalgia: M79.7

## 2015-01-23 HISTORY — DX: Malignant neoplasm of colon, unspecified: C18.9

## 2015-01-23 LAB — CBC WITH DIFFERENTIAL/PLATELET
Basophils Absolute: 0 10*3/uL (ref 0.0–0.1)
Basophils Relative: 0 % (ref 0–1)
EOS ABS: 0 10*3/uL (ref 0.0–0.7)
Eosinophils Relative: 0 % (ref 0–5)
HCT: 36.6 % (ref 36.0–46.0)
Hemoglobin: 11.5 g/dL — ABNORMAL LOW (ref 12.0–15.0)
Lymphocytes Relative: 25 % (ref 12–46)
Lymphs Abs: 1.9 10*3/uL (ref 0.7–4.0)
MCH: 28.4 pg (ref 26.0–34.0)
MCHC: 31.4 g/dL (ref 30.0–36.0)
MCV: 90.4 fL (ref 78.0–100.0)
MONO ABS: 0.7 10*3/uL (ref 0.1–1.0)
MONOS PCT: 9 % (ref 3–12)
NEUTROS PCT: 66 % (ref 43–77)
Neutro Abs: 5.1 10*3/uL (ref 1.7–7.7)
Platelets: 197 10*3/uL (ref 150–400)
RBC: 4.05 MIL/uL (ref 3.87–5.11)
RDW: 13 % (ref 11.5–15.5)
WBC: 7.8 10*3/uL (ref 4.0–10.5)

## 2015-01-23 LAB — URINALYSIS, ROUTINE W REFLEX MICROSCOPIC
Glucose, UA: NEGATIVE mg/dL
KETONES UR: NEGATIVE mg/dL
NITRITE: NEGATIVE
PH: 5.5 (ref 5.0–8.0)
Protein, ur: 100 mg/dL — AB
Specific Gravity, Urine: 1.023 (ref 1.005–1.030)
UROBILINOGEN UA: 1 mg/dL (ref 0.0–1.0)

## 2015-01-23 LAB — COMPREHENSIVE METABOLIC PANEL
ALBUMIN: 3.2 g/dL — AB (ref 3.5–5.2)
ALT: 13 U/L (ref 0–35)
AST: 35 U/L (ref 0–37)
Alkaline Phosphatase: 81 U/L (ref 39–117)
Anion gap: 5 (ref 5–15)
BILIRUBIN TOTAL: 0.5 mg/dL (ref 0.3–1.2)
BUN: 28 mg/dL — ABNORMAL HIGH (ref 6–23)
CALCIUM: 9.5 mg/dL (ref 8.4–10.5)
CO2: 26 mmol/L (ref 19–32)
CREATININE: 1.61 mg/dL — AB (ref 0.50–1.10)
Chloride: 106 mmol/L (ref 96–112)
GFR calc Af Amer: 30 mL/min — ABNORMAL LOW (ref >90)
GFR calc non Af Amer: 26 mL/min — ABNORMAL LOW (ref >90)
GLUCOSE: 87 mg/dL (ref 70–99)
Potassium: 3.6 mmol/L (ref 3.5–5.1)
SODIUM: 137 mmol/L (ref 135–145)
TOTAL PROTEIN: 7 g/dL (ref 6.0–8.3)

## 2015-01-23 LAB — URINE MICROSCOPIC-ADD ON

## 2015-01-23 LAB — TROPONIN I: TROPONIN I: 0.04 ng/mL — AB (ref <0.031)

## 2015-01-23 MED ORDER — ALBUTEROL SULFATE (2.5 MG/3ML) 0.083% IN NEBU
5.0000 mg | INHALATION_SOLUTION | Freq: Once | RESPIRATORY_TRACT | Status: AC
  Administered 2015-01-23: 5 mg via RESPIRATORY_TRACT
  Filled 2015-01-23: qty 6

## 2015-01-23 MED ORDER — SODIUM CHLORIDE 0.9 % IV BOLUS (SEPSIS)
500.0000 mL | Freq: Once | INTRAVENOUS | Status: AC
  Administered 2015-01-23: 500 mL via INTRAVENOUS

## 2015-01-23 NOTE — ED Notes (Signed)
Bed: WA08 Expected date:  Expected time:  Means of arrival:  Comments: ems 

## 2015-01-23 NOTE — ED Provider Notes (Signed)
CSN: [id number]     Arrival date & time 01/23/15  1847 History   First MD Initiated Contact with Patient 01/23/15 1852     Chief Complaint  Patient presents with  . Cough     (Consider location/radiation/quality/duration/timing/severity/associated sxs/prior Treatment) Patient is a 81 y.o. female presenting with cough. The history is provided by a caregiver.  Cough Cough characteristics:  Unable to specify Severity:  Moderate Onset quality:  Unable to specify Timing:  Constant Progression:  Unable to specify Chronicity:  New Context: upper respiratory infection   Relieved by:  Nothing Worsened by:  Nothing tried Ineffective treatments:  None tried Associated symptoms: fever   Associated symptoms: no chest pain, no headaches and no shortness of breath     Past Medical History  Diagnosis Date  . Arthritis   . Anxiety    History reviewed. No pertinent past surgical history. History reviewed. No pertinent family history. History  Substance Use Topics  . Smoking status: Never Smoker   . Smokeless tobacco: Never Used  . Alcohol Use: Yes   OB History    No data available     Review of Systems  Constitutional: Positive for fever. Negative for fatigue.  HENT: Negative for congestion and drooling.   Eyes: Negative for pain.  Respiratory: Positive for cough. Negative for shortness of breath.   Cardiovascular: Negative for chest pain.  Gastrointestinal: Positive for nausea and vomiting. Negative for abdominal pain and diarrhea.  Genitourinary: Negative for dysuria and hematuria.  Musculoskeletal: Negative for back pain, gait problem and neck pain.  Skin: Negative for color change.  Neurological: Negative for dizziness and headaches.       Confusion  Hematological: Negative for adenopathy.  Psychiatric/Behavioral: Negative for behavioral problems.  All other systems reviewed and are negative.     Allergies  Review of patient's allergies indicates no known  allergies.  Home Medications   Prior to Admission medications   Medication Sig Start Date End Date Taking? Authorizing Provider  ALPRAZolam (XANAX) 0.25 MG tablet Take 0.5 mg by mouth every morning. For nerves   Yes Historical Provider, MD  acetaminophen (TYLENOL) 500 MG tablet Take 1,000 mg by mouth every 6 (six) hours as needed. For pain    Historical Provider, MD  celecoxib (CELEBREX) 100 MG capsule Take one pill twice a day for pain 07/04/12   Maudry Diego, MD  HYDROcodone-acetaminophen [facility] 5-325 MG per tablet Take one every 6 hours for pain not helped by celebrex or tylenol 07/04/12   Maudry Diego, MD  Menthol, Topical Analgesic, (BEN GAY) 1.4 % PTCH Apply 1 patch topically at bedtime as needed. Apply to hip at bedtime as needed for pain    Historical Provider, MD   BP 149/55 mmHg  Pulse 71  Temp(Src) 99 F (37.2 C) (Oral)  Resp 20  SpO2 95% Physical Exam  Constitutional: She appears well-developed and well-nourished.  HENT:  Head: Normocephalic.  Mouth/Throat: Oropharynx is clear and moist. No oropharyngeal exudate.  Eyes: Conjunctivae and EOM are normal. Pupils are equal, round, and reactive to light.  Neck: Normal range of motion. Neck supple.  Cardiovascular: Normal rate, regular rhythm, normal heart sounds and intact distal pulses.  Exam reveals no gallop and no friction rub.   No murmur heard. Pulmonary/Chest: Effort normal and breath sounds normal. No respiratory distress. She has no wheezes.  Abdominal: Soft. Bowel sounds are normal. There is no tenderness. There is no rebound and no guarding.  Musculoskeletal: Normal range of  motion. She exhibits no edema or tenderness.  Neurological: She is alert.  A/o x 1.   Moving all extremities.   Skin: Skin is warm and dry.  Psychiatric: She has a normal mood and affect. Her behavior is normal.  Nursing note and vitals reviewed.   ED Course  Procedures (including critical care time) Labs Review Labs Reviewed  CBC  WITH DIFFERENTIAL/PLATELET - Abnormal; Notable for the following:    Hemoglobin 11.5 (*)    All other components within normal limits  COMPREHENSIVE METABOLIC PANEL - Abnormal; Notable for the following:    BUN 28 (*)    Creatinine, Ser 1.61 (*)    Albumin 3.2 (*)    GFR calc non Af Amer 26 (*)    GFR calc Af Amer 30 (*)    All other components within normal limits  URINALYSIS, ROUTINE W REFLEX MICROSCOPIC - Abnormal; Notable for the following:    Color, Urine AMBER (*)    APPearance TURBID (*)    Hgb urine dipstick SMALL (*)    Bilirubin Urine SMALL (*)    Protein, ur 100 (*)    Leukocytes, UA MODERATE (*)    All other components within normal limits  TROPONIN I - Abnormal; Notable for the following:    Troponin I 0.04 (*)    All other components within normal limits  URINE MICROSCOPIC-ADD ON - Abnormal; Notable for the following:    Squamous Epithelial / LPF MANY (*)    All other components within normal limits  TROPONIN I - Abnormal; Notable for the following:    Troponin I 0.04 (*)    All other components within normal limits  TROPONIN I - Abnormal; Notable for the following:    Troponin I 0.04 (*)    All other components within normal limits  TROPONIN I - Abnormal; Notable for the following:    Troponin I 0.04 (*)    All other components within normal limits  COMPREHENSIVE METABOLIC PANEL - Abnormal; Notable for the following:    Potassium 3.4 (*)    Glucose, Bld 103 (*)    Creatinine, Ser 1.37 (*)    Albumin 3.0 (*)    GFR calc non Af Amer 32 (*)    GFR calc Af Amer 37 (*)    All other components within normal limits  CBC WITH DIFFERENTIAL/PLATELET - Abnormal; Notable for the following:    RBC 3.85 (*)    Hemoglobin 11.0 (*)    HCT 34.8 (*)    All other components within normal limits  D-DIMER, QUANTITATIVE - Abnormal; Notable for the following:    D-Dimer, Quant 1.52 (*)    All other components within normal limits  URINE CULTURE  INFLUENZA PANEL BY PCR (TYPE  A & B, H1N1)  CBC    Imaging Review Dg Chest 2 View  01/23/2015   CLINICAL DATA:  Pt sts she has had a cough for 2-3 days with some congestion. Pt seems slightly disoriented at times but responsive to some questions and directions. Denies heart/lung/chest hx of issues.  EXAM: CHEST  2 VIEW  COMPARISON:  11/30/2014  FINDINGS: Cardiac silhouette mildly enlarged. No mediastinal or hilar masses. There are linear opacities in the left lower lobe consistent with chronic scarring. There is also chronic bronchitic change most evident in the medial left lower lobe. No lung consolidation or edema. No pleural effusion or pneumothorax.  Bony thorax is demineralized but grossly intact.  IMPRESSION: No acute cardiopulmonary disease.   Electronically  Signed   By: Lajean Manes M.D.   On: 01/23/2015 20:43   Dg Pelvis 1-2 Views  01/23/2015   CLINICAL DATA:  Altered mental status and emesis. Decreased oxygen saturation. Fever. Fell 2-4 weeks ago.  EXAM: PELVIS - 1-2 VIEW  COMPARISON:  04/30/2012  FINDINGS: Right hip demonstrates prominent degenerative changes with severe joint space loss, sclerosis and hypertrophic changes on both sides of the joint, and bone lucency and collapse along the superior femoral head with associated remodeling. This appears similar to previous study without significant progression. No definite evidence of any acute fracture and no dislocation. Minimal degenerative changes in the left hip. Pelvis appears intact. SI joints and symphysis pubis are not displaced. Degenerative changes and scoliosis of the lumbar spine. Surgical clips in the upper pelvis.  IMPRESSION: Severe degenerative changes in the right hip appear similar to prior study. No definite acute fracture.   Electronically Signed   By: Lucienne Capers M.D.   On: 01/23/2015 23:46   Ct Head Wo Contrast  01/23/2015   CLINICAL DATA:  Altered mental status. Fever. History of colon cancer and ovarian cancer. Patient fell 2 weeks ago.  EXAM:  CT HEAD WITHOUT CONTRAST  TECHNIQUE: Contiguous axial images were obtained from the base of the skull through the vertex without intravenous contrast.  COMPARISON:  MRI brain 06/27/2011.  CT head 01/24/2005.  FINDINGS: Diffuse cerebral atrophy. Mild ventricular dilatation consistent with central atrophy. Low-attenuation changes throughout the deep white matter consistent with small vessel ischemia. Atrophy and small vessel changes have increased since the previous head CT. No mass effect or midline shift. No abnormal extra-axial fluid collections. Gray-white matter junctions are distinct. Basal cisterns are not effaced. No evidence of acute intracranial hemorrhage. No depressed skull fractures. Diffuse mucosal thickening throughout the paranasal sinuses. Mastoid air cells are not opacified. Vascular calcifications. Prominent degenerative changes at C1 to level.  IMPRESSION: No acute intracranial abnormalities. Chronic atrophy and small vessel ischemic changes.   Electronically Signed   By: Lucienne Capers M.D.   On: 01/23/2015 23:34   Nm Pulmonary Perfusion  01/24/2015   CLINICAL DATA:  Shortness of breath.  Wheezing.  EXAM: NUCLEAR MEDICINE PERFUSION SCAN  TECHNIQUE: Perfusion images were obtained in multiple projections after intravenous injection of radiopharmaceutical.  RADIOPHARMACEUTICALS:  5.0 MCi Tc88m MAA  COMPARISON:  Chest x-ray 01/23/2015.  FINDINGS: Prominent perfusion defect noted in the right lung base. Multiple tiny are bilateral perfusion defects are present.  IMPRESSION: Prominent perfusion defect right lung base with multiple tiny bilateral perfusion defects suspicious for the possibility of pulmonary embolus. This is a limited exam as ventilation scan could not be performed.   Electronically Signed   By: Marcello Moores  Register   On: 01/24/2015 10:28     EKG Interpretation   Date/Time:  Sunday January 23 2015 18:52:24 EST Ventricular Rate:  74 PR Interval:  145 QRS Duration: 130 QT  Interval:  459 QTC Calculation: 509 R Axis:   49 Text Interpretation:  Sinus rhythm IVCD, consider atypical LBBB changes  noted Confirmed by Livier Hendel  MD, Curstin Schmale ([id number]) on 01/23/2015 8:02:45 PM     CRITICAL CARE Performed by: Pamella Pert, S Total critical care time: 30 min Critical care time was exclusive of separately billable procedures and treating other patients. Critical care was necessary to treat or prevent imminent or life-threatening deterioration. Critical care was time spent personally by me on the following activities: development of treatment plan with patient and/or surrogate as well as nursing, discussions  with consultants, evaluation of patient's response to treatment, examination of patient, obtaining history from patient or surrogate, ordering and performing treatments and interventions, ordering and review of laboratory studies, ordering and review of radiographic studies, pulse oximetry and re-evaluation of patient's condition.  MDM   Final diagnoses:  Cough  Elevated troponin  Hypoxia    7:25 PM 81 y.o. female who presents with cough and an episode of emesis from her facility at [facility]. I spoke with her nurse Sondra Come, who states that she had an episode of emesis this evening and has also had a cough for an unknown period of time. The nurse reports that the patient has had some decreased responsiveness and some confusion since yesterday. She was found to have a low-grade temperature of 100.6 by EMS at the facility. She was also found to have some mild hypoxia with an O2 sat of 88% on room air. She is alert and oriented 1 here. I suspect an infectious process is driving her confusion. We'll get screening labs and imaging.  Found to have elevated troponin. Pt also hypoxic on room air. Radiology recommended V/Q scan if concerned for PE d/t low GFR. Will defer to hospitalist for anti-coagulation. Will admit to hospitalist.     Pamella Pert,  MD 01/24/15 2204

## 2015-01-23 NOTE — H&P (Addendum)
[facility] History and Physical  [name] PYP:[id number] DOB: 1918/04/22 DOA: 01/23/2015  Referring physician: ER physician. PCP: Wenda Low, MD  Chief Complaint: Cough shortness of breath and drowsiness.  HPI: [name] is a 81 y.o. female with history of chronic kidney disease, history of hypertension presently on no medications, history of colon cancer and ovarian cancer in remission was brought to the ER from patient's nursing home after patient was found to be having cough with cold and wheezing over the last 2-3 days with patient's daughter finding the patient has become increasingly drowsy. Patient has had a fall last month and have become more bedbound. Patient also was placed recently on Percocet for pain regarding her osteoarthritis of the hip. In the ER patient was found to be hypoxic with wheezing and cough nonproductive. Chest x-ray does not show anything acute. Patient had mild fever. Troponins were minimally elevated. EKG was showing atypical LBBB. She does not complain of any chest pain. Patient is presently alert and awake oriented to her name and place. Moves all extremities. Patient will be admitted for further management for hypoxia and elevated troponin.   Review of Systems: As presented in the history of presenting illness, rest negative.  Past Medical History  Diagnosis Date  . Arthritis   . Anxiety   . Fibromyalgia   . Colon cancer   . Ovarian cancer    Past Surgical History  Procedure Laterality Date  . Colon surgery    . Abdominal hysterectomy     Social History:  reports that she has never smoked. She has never used smokeless tobacco. She reports that she drinks alcohol. She reports that she does not use illicit drugs. Where does patient live nursing home. Can patient participate in ADLs? No.  No Known Allergies  Family History: History reviewed. No pertinent family history.    Prior to Admission medications   Medication Sig Start  Date End Date Taking? Authorizing Provider  ALPRAZolam (XANAX) 0.25 MG tablet Take 0.5 mg by mouth every morning. For nerves   Yes Historical Provider, MD  Calcium Carbonate-Vitamin D 600-200 MG-UNIT TABS Take 1 tablet by mouth daily before lunch.   Yes Historical Provider, MD  cholecalciferol (VITAMIN D) 1000 UNITS tablet Take 1,000 Units by mouth daily.   Yes Historical Provider, MD  mirtazapine (REMERON) 7.5 MG tablet Take 7.5 mg by mouth at bedtime.   Yes Historical Provider, MD  ondansetron (ZOFRAN) 8 MG tablet Take 8 mg by mouth every 8 (eight) hours as needed for nausea or vomiting.   Yes Historical Provider, MD  oxyCODONE-acetaminophen (PERCOCET/ROXICET) 5-325 MG per tablet Take 1 tablet by mouth 3 (three) times daily.   Yes Historical Provider, MD  ranitidine (ZANTAC) 150 MG capsule Take 150 mg by mouth 2 (two) times daily.   Yes Historical Provider, MD  sertraline (ZOLOFT) 50 MG tablet Take 50 mg by mouth at bedtime.   Yes Historical Provider, MD  tobramycin (TOBREX) 0.3 % ophthalmic solution Place 2 drops into both eyes 4 (four) times daily.   Yes Historical Provider, MD  Vitamin D, Ergocalciferol, (DRISDOL) 50000 UNITS CAPS capsule Take 50,000 Units by mouth every 7 (seven) days.   Yes Historical Provider, MD  celecoxib (CELEBREX) 100 MG capsule Take one pill twice a day for pain 07/04/12   Maudry Diego, MD  HYDROcodone-acetaminophen [facility]) 5-325 MG per tablet Take one every 6 hours for pain not helped by celebrex or tylenol 07/04/12   Maudry Diego, MD  Physical Exam: Filed Vitals:   01/23/15 1847 01/23/15 1851 01/23/15 1928 01/23/15 2054  BP:  149/55    Pulse:  71    Temp:  99 F (37.2 C) 99.8 F (37.7 C)   TempSrc:  Oral Rectal   Resp:  20    SpO2: 94% 95%  95%     General:  Moderately built and nourished.  Eyes: Anicteric no pallor.  ENT: No discharge from the ears eyes nose or mouth.  Neck: No mass felt.  Cardiovascular: S1 and S2 heard.  Respiratory: No  rhonchi or crepitations.  Abdomen: Soft nontender bowel sounds present.  Skin: No rash.  Musculoskeletal: No edema.  Psychiatric: Alert and awake and oriented to her name and place.  Neurologic: Alert awake oriented to name and place. Moves all extremities.  Labs on Admission:  Basic Metabolic Panel:  Recent Labs Lab 01/23/15 1925  NA 137  K 3.6  CL 106  CO2 26  GLUCOSE 87  BUN 28*  CREATININE 1.61*  CALCIUM 9.5   Liver Function Tests:  Recent Labs Lab 01/23/15 1925  AST 35  ALT 13  ALKPHOS 81  BILITOT 0.5  PROT 7.0  ALBUMIN 3.2*   No results for input(s): LIPASE, AMYLASE in the last 168 hours. No results for input(s): AMMONIA in the last 168 hours. CBC:  Recent Labs Lab 01/23/15 1925  WBC 7.8  NEUTROABS 5.1  HGB 11.5*  HCT 36.6  MCV 90.4  PLT 197   Cardiac Enzymes:  Recent Labs Lab 01/23/15 2018  TROPONINI 0.04*    BNP (last 3 results) No results for input(s): PROBNP in the last 8760 hours. CBG: No results for input(s): GLUCAP in the last 168 hours.  Radiological Exams on Admission: Dg Chest 2 View  01/23/2015   CLINICAL DATA:  Pt sts she has had a cough for 2-3 days with some congestion. Pt seems slightly disoriented at times but responsive to some questions and directions. Denies heart/lung/chest hx of issues.  EXAM: CHEST  2 VIEW  COMPARISON:  11/30/2014  FINDINGS: Cardiac silhouette mildly enlarged. No mediastinal or hilar masses. There are linear opacities in the left lower lobe consistent with chronic scarring. There is also chronic bronchitic change most evident in the medial left lower lobe. No lung consolidation or edema. No pleural effusion or pneumothorax.  Bony thorax is demineralized but grossly intact.  IMPRESSION: No acute cardiopulmonary disease.   Electronically Signed   By: Lajean Manes M.D.   On: 01/23/2015 20:43    EKG: Independently reviewed. Normal sinus rhythm with atypical LBBB.  Assessment/Plan Principal Problem:    Hypoxia Active Problems:   Elevated troponin   CKD (chronic kidney disease) stage 3, GFR 30-59 ml/min   Chronic anemia   SOB (shortness of breath)   1. Shortness of breath with cough and hypoxia - suspect most likely from bronchitis. However since patient has been recently more bedbound we will need to check for possible PE for which I have ordered a d-dimer if elevated will get a VQ scan since patient cannot get a CAT scan contrast secondary to chronic kidney disease. I have placed patient on doxycycline for now. Check influenza PCR and closely follow respiratory status. 2. Elevated troponin - patient denies any chest pain. Cycle cardiac markers and will place patient on aspirin if CT head is negative for any bleeding since patient has had recent fall. 3. Acute encephalopathy/drowsiness - probably may be related to recent addition of pain relief medication. I  have made patient's Percocet when necessary institute of getting scheduled dose. Patient is presently alert and awake. Closely observe. 4. Chronic kidney disease - creatinine appears to be at baseline. Closely follow metabolic panel. 5. Anemia probably from kidney disease - follow CBC. 6. History of hypertension - presently on no antihypertensives. Positive for low blood pressure trends. 7. History of ovarian cancer and colon cancer. 8. Arthritis of the hip and patient has become more bedbound. 9. Possible UTI - patient has been placed on empiric antibiotics for urine cultures.  I have discussed with patient's daughter who has stated that patient is a DO NOT RESUSCITATE and if patient's condition worsens and may be leaning towards comfort measures. Consulted palliative care.   DVT Prophylaxis Lovenox if CT head is negative.  Code Status: DO NOT RESUSCITATE.  Family Communication: Patient's daughter.  Disposition Plan: Admit to inpatient.    Akyla Vavrek N. [facility] Pager [phone].  If 7PM-7AM, please contact  night-coverage [email or web address] Password [facility] 01/23/2015, 11:03 PM

## 2015-01-23 NOTE — ED Notes (Signed)
Pt from [employer] via EMS-Per EMS pt family was visiting and sts that pt has AMS along with emesis. EMS reports that pt O2 was 89% on RA on scene and temp was 100.6. Pt was placed on 2L and O2 is ow 95% with EMS. EMS reports that pt had adventitious lung sounds to lower right lung. Pt is A&O, is HOH and in NAD.

## 2015-01-23 NOTE — ED Notes (Signed)
Pt arousable, oriented to self, place(hospital).

## 2015-01-23 NOTE — ED Notes (Signed)
Pt to be transported to radiology CT Scan and XR prior to going to 4th floor.

## 2015-01-24 ENCOUNTER — Encounter (HOSPITAL_COMMUNITY): Payer: Self-pay | Admitting: *Deleted

## 2015-01-24 ENCOUNTER — Observation Stay (HOSPITAL_COMMUNITY): Payer: Medicare Other

## 2015-01-24 DIAGNOSIS — R627 Adult failure to thrive: Secondary | ICD-10-CM | POA: Diagnosis present

## 2015-01-24 DIAGNOSIS — Z66 Do not resuscitate: Secondary | ICD-10-CM

## 2015-01-24 DIAGNOSIS — M797 Fibromyalgia: Secondary | ICD-10-CM | POA: Diagnosis present

## 2015-01-24 DIAGNOSIS — R0600 Dyspnea, unspecified: Secondary | ICD-10-CM

## 2015-01-24 DIAGNOSIS — M1610 Unilateral primary osteoarthritis, unspecified hip: Secondary | ICD-10-CM | POA: Diagnosis present

## 2015-01-24 DIAGNOSIS — I129 Hypertensive chronic kidney disease with stage 1 through stage 4 chronic kidney disease, or unspecified chronic kidney disease: Secondary | ICD-10-CM | POA: Diagnosis present

## 2015-01-24 DIAGNOSIS — N179 Acute kidney failure, unspecified: Secondary | ICD-10-CM | POA: Diagnosis present

## 2015-01-24 DIAGNOSIS — I447 Left bundle-branch block, unspecified: Secondary | ICD-10-CM | POA: Diagnosis present

## 2015-01-24 DIAGNOSIS — Z85038 Personal history of other malignant neoplasm of large intestine: Secondary | ICD-10-CM | POA: Diagnosis not present

## 2015-01-24 DIAGNOSIS — Z8543 Personal history of malignant neoplasm of ovary: Secondary | ICD-10-CM | POA: Diagnosis not present

## 2015-01-24 DIAGNOSIS — Z7401 Bed confinement status: Secondary | ICD-10-CM | POA: Diagnosis not present

## 2015-01-24 DIAGNOSIS — Z79899 Other long term (current) drug therapy: Secondary | ICD-10-CM | POA: Diagnosis not present

## 2015-01-24 DIAGNOSIS — G9340 Encephalopathy, unspecified: Secondary | ICD-10-CM | POA: Diagnosis present

## 2015-01-24 DIAGNOSIS — R05 Cough: Secondary | ICD-10-CM | POA: Diagnosis present

## 2015-01-24 DIAGNOSIS — I2699 Other pulmonary embolism without acute cor pulmonale: Secondary | ICD-10-CM | POA: Diagnosis present

## 2015-01-24 DIAGNOSIS — F419 Anxiety disorder, unspecified: Secondary | ICD-10-CM | POA: Diagnosis present

## 2015-01-24 DIAGNOSIS — H109 Unspecified conjunctivitis: Secondary | ICD-10-CM | POA: Diagnosis present

## 2015-01-24 DIAGNOSIS — D631 Anemia in chronic kidney disease: Secondary | ICD-10-CM | POA: Diagnosis present

## 2015-01-24 DIAGNOSIS — N390 Urinary tract infection, site not specified: Secondary | ICD-10-CM | POA: Diagnosis present

## 2015-01-24 DIAGNOSIS — Z515 Encounter for palliative care: Secondary | ICD-10-CM

## 2015-01-24 DIAGNOSIS — D649 Anemia, unspecified: Secondary | ICD-10-CM

## 2015-01-24 DIAGNOSIS — J209 Acute bronchitis, unspecified: Secondary | ICD-10-CM | POA: Diagnosis present

## 2015-01-24 DIAGNOSIS — R531 Weakness: Secondary | ICD-10-CM

## 2015-01-24 DIAGNOSIS — J9601 Acute respiratory failure with hypoxia: Secondary | ICD-10-CM | POA: Diagnosis present

## 2015-01-24 DIAGNOSIS — Z9181 History of falling: Secondary | ICD-10-CM | POA: Diagnosis not present

## 2015-01-24 DIAGNOSIS — N183 Chronic kidney disease, stage 3 (moderate): Secondary | ICD-10-CM | POA: Diagnosis present

## 2015-01-24 DIAGNOSIS — R0902 Hypoxemia: Secondary | ICD-10-CM

## 2015-01-24 DIAGNOSIS — E43 Unspecified severe protein-calorie malnutrition: Secondary | ICD-10-CM | POA: Diagnosis present

## 2015-01-24 LAB — COMPREHENSIVE METABOLIC PANEL
ALT: 14 U/L (ref 0–35)
AST: 36 U/L (ref 0–37)
Albumin: 3 g/dL — ABNORMAL LOW (ref 3.5–5.2)
Alkaline Phosphatase: 75 U/L (ref 39–117)
Anion gap: 7 (ref 5–15)
BUN: 23 mg/dL (ref 6–23)
CO2: 26 mmol/L (ref 19–32)
Calcium: 9.5 mg/dL (ref 8.4–10.5)
Chloride: 107 mmol/L (ref 96–112)
Creatinine, Ser: 1.37 mg/dL — ABNORMAL HIGH (ref 0.50–1.10)
GFR, EST AFRICAN AMERICAN: 37 mL/min — AB (ref >90)
GFR, EST NON AFRICAN AMERICAN: 32 mL/min — AB (ref >90)
Glucose, Bld: 103 mg/dL — ABNORMAL HIGH (ref 70–99)
Potassium: 3.4 mmol/L — ABNORMAL LOW (ref 3.5–5.1)
SODIUM: 140 mmol/L (ref 135–145)
Total Bilirubin: 0.6 mg/dL (ref 0.3–1.2)
Total Protein: 6.4 g/dL (ref 6.0–8.3)

## 2015-01-24 LAB — CBC WITH DIFFERENTIAL/PLATELET
BASOS PCT: 1 % (ref 0–1)
Basophils Absolute: 0 10*3/uL (ref 0.0–0.1)
EOS ABS: 0 10*3/uL (ref 0.0–0.7)
Eosinophils Relative: 0 % (ref 0–5)
HEMATOCRIT: 34.8 % — AB (ref 36.0–46.0)
Hemoglobin: 11 g/dL — ABNORMAL LOW (ref 12.0–15.0)
LYMPHS PCT: 24 % (ref 12–46)
Lymphs Abs: 1.5 10*3/uL (ref 0.7–4.0)
MCH: 28.6 pg (ref 26.0–34.0)
MCHC: 31.6 g/dL (ref 30.0–36.0)
MCV: 90.4 fL (ref 78.0–100.0)
MONO ABS: 0.5 10*3/uL (ref 0.1–1.0)
Monocytes Relative: 9 % (ref 3–12)
Neutro Abs: 4.1 10*3/uL (ref 1.7–7.7)
Neutrophils Relative %: 66 % (ref 43–77)
Platelets: 167 10*3/uL (ref 150–400)
RBC: 3.85 MIL/uL — ABNORMAL LOW (ref 3.87–5.11)
RDW: 13 % (ref 11.5–15.5)
WBC: 6.1 10*3/uL (ref 4.0–10.5)

## 2015-01-24 LAB — TROPONIN I
TROPONIN I: 0.04 ng/mL — AB (ref <0.031)
TROPONIN I: 0.04 ng/mL — AB (ref <0.031)
Troponin I: 0.04 ng/mL — ABNORMAL HIGH (ref <0.031)

## 2015-01-24 LAB — INFLUENZA PANEL BY PCR (TYPE A & B)
H1N1 flu by pcr: NOT DETECTED
INFLAPCR: NEGATIVE
Influenza B By PCR: NEGATIVE

## 2015-01-24 LAB — D-DIMER, QUANTITATIVE (NOT AT ARMC): D DIMER QUANT: 1.52 ug{FEU}/mL — AB (ref 0.00–0.48)

## 2015-01-24 MED ORDER — MIRTAZAPINE 7.5 MG PO TABS
7.5000 mg | ORAL_TABLET | Freq: Every day | ORAL | Status: DC
  Administered 2015-01-24 – 2015-01-25 (×3): 7.5 mg via ORAL
  Filled 2015-01-24 (×4): qty 1

## 2015-01-24 MED ORDER — ENSURE COMPLETE PO LIQD
237.0000 mL | Freq: Two times a day (BID) | ORAL | Status: DC
  Administered 2015-01-25 – 2015-01-26 (×2): 237 mL via ORAL

## 2015-01-24 MED ORDER — ONDANSETRON HCL 4 MG PO TABS: 8.0000 mg | ORAL_TABLET | Freq: Three times a day (TID) | ORAL | Status: DC | PRN

## 2015-01-24 MED ORDER — HYDRALAZINE HCL 20 MG/ML IJ SOLN: 5.0000 mg | Freq: Four times a day (QID) | INTRAMUSCULAR | Status: DC | PRN

## 2015-01-24 MED ORDER — ONDANSETRON HCL 4 MG PO TABS: 4.0000 mg | ORAL_TABLET | Freq: Four times a day (QID) | ORAL | Status: DC | PRN

## 2015-01-24 MED ORDER — TECHNETIUM TO 99M ALBUMIN AGGREGATED
5.0000 | Freq: Once | INTRAVENOUS | Status: AC | PRN
  Administered 2015-01-24: 5 via INTRAVENOUS

## 2015-01-24 MED ORDER — ACETAMINOPHEN 650 MG RE SUPP: 650.0000 mg | Freq: Four times a day (QID) | RECTAL | Status: DC | PRN

## 2015-01-24 MED ORDER — ENOXAPARIN SODIUM 40 MG/0.4ML ~~LOC~~ SOLN
40.0000 mg | SUBCUTANEOUS | Status: DC
  Administered 2015-01-24: 40 mg via SUBCUTANEOUS
  Filled 2015-01-24: qty 0.4

## 2015-01-24 MED ORDER — SODIUM CHLORIDE 0.9 % IJ SOLN: 3.0000 mL | Freq: Two times a day (BID) | INTRAMUSCULAR | Status: DC

## 2015-01-24 MED ORDER — ACETAMINOPHEN 325 MG PO TABS: 650.0000 mg | ORAL_TABLET | Freq: Four times a day (QID) | ORAL | Status: DC | PRN

## 2015-01-24 MED ORDER — SERTRALINE HCL 50 MG PO TABS
50.0000 mg | ORAL_TABLET | Freq: Every day | ORAL | Status: DC
  Administered 2015-01-24 – 2015-01-25 (×3): 50 mg via ORAL
  Filled 2015-01-24 (×4): qty 1

## 2015-01-24 MED ORDER — CHLORHEXIDINE GLUCONATE 0.12 % MT SOLN
15.0000 mL | Freq: Two times a day (BID) | OROMUCOSAL | Status: DC
  Administered 2015-01-24 – 2015-01-26 (×5): 15 mL via OROMUCOSAL
  Filled 2015-01-24 (×6): qty 15

## 2015-01-24 MED ORDER — TOBRAMYCIN 0.3 % OP SOLN
2.0000 [drp] | Freq: Four times a day (QID) | OPHTHALMIC | Status: DC
  Administered 2015-01-24 – 2015-01-26 (×10): 2 [drp] via OPHTHALMIC
  Filled 2015-01-24: qty 5

## 2015-01-24 MED ORDER — SODIUM CHLORIDE 0.9 % IV SOLN
INTRAVENOUS | Status: AC
  Administered 2015-01-24: via INTRAVENOUS

## 2015-01-24 MED ORDER — OXYCODONE-ACETAMINOPHEN 5-325 MG PO TABS: 1.0000 | ORAL_TABLET | Freq: Three times a day (TID) | ORAL | Status: DC | PRN

## 2015-01-24 MED ORDER — CETYLPYRIDINIUM CHLORIDE 0.05 % MT LIQD
7.0000 mL | Freq: Two times a day (BID) | OROMUCOSAL | Status: DC
  Administered 2015-01-24 – 2015-01-25 (×3): 7 mL via OROMUCOSAL

## 2015-01-24 MED ORDER — ENOXAPARIN SODIUM 30 MG/0.3ML ~~LOC~~ SOLN
30.0000 mg | SUBCUTANEOUS | Status: DC
  Filled 2015-01-24: qty 0.3

## 2015-01-24 MED ORDER — ONDANSETRON HCL 4 MG/2ML IJ SOLN: 4.0000 mg | Freq: Four times a day (QID) | INTRAMUSCULAR | Status: DC | PRN

## 2015-01-24 MED ORDER — FAMOTIDINE 20 MG PO TABS
20.0000 mg | ORAL_TABLET | Freq: Every day | ORAL | Status: DC
  Administered 2015-01-24 – 2015-01-26 (×3): 20 mg via ORAL
  Filled 2015-01-24 (×3): qty 1

## 2015-01-24 MED ORDER — VITAMIN D (ERGOCALCIFEROL) 1.25 MG (50000 UNIT) PO CAPS: 50000.0000 [IU] | ORAL_CAPSULE | ORAL | Status: DC

## 2015-01-24 MED ORDER — BUDESONIDE 0.25 MG/2ML IN SUSP
0.2500 mg | Freq: Two times a day (BID) | RESPIRATORY_TRACT | Status: DC
  Administered 2015-01-24 – 2015-01-26 (×5): 0.25 mg via RESPIRATORY_TRACT
  Filled 2015-01-24 (×5): qty 2

## 2015-01-24 MED ORDER — DEXTROSE 5 % IV SOLN
100.0000 mg | Freq: Two times a day (BID) | INTRAVENOUS | Status: DC
  Administered 2015-01-24: 100 mg via INTRAVENOUS
  Filled 2015-01-24 (×2): qty 100

## 2015-01-24 MED ORDER — NITROGLYCERIN 0.4 MG SL SUBL: 0.4000 mg | SUBLINGUAL_TABLET | SUBLINGUAL | Status: DC | PRN

## 2015-01-24 MED ORDER — LEVOFLOXACIN IN D5W 750 MG/150ML IV SOLN
750.0000 mg | Freq: Once | INTRAVENOUS | Status: AC
  Administered 2015-01-24: 750 mg via INTRAVENOUS
  Filled 2015-01-24: qty 150

## 2015-01-24 MED ORDER — ALPRAZOLAM 0.5 MG PO TABS
0.5000 mg | ORAL_TABLET | Freq: Every day | ORAL | Status: DC
  Administered 2015-01-24 – 2015-01-26 (×3): 0.5 mg via ORAL
  Filled 2015-01-24 (×3): qty 1

## 2015-01-24 MED ORDER — LEVOFLOXACIN IN D5W 750 MG/150ML IV SOLN
750.0000 mg | INTRAVENOUS | Status: DC
  Filled 2015-01-24: qty 150

## 2015-01-24 MED ORDER — ASPIRIN 325 MG PO TABS
325.0000 mg | ORAL_TABLET | Freq: Every day | ORAL | Status: DC
  Administered 2015-01-24 – 2015-01-26 (×3): 325 mg via ORAL
  Filled 2015-01-24 (×3): qty 1

## 2015-01-24 NOTE — Progress Notes (Signed)
Clinical Social Work Department BRIEF PSYCHOSOCIAL ASSESSMENT 01/24/2015  Patient:  [name], [name]     Account Number:  [id number]     [place] date:  01/23/2015  Clinical Social Worker:  Earlie Server  Date/Time:  01/24/2015 04:00 PM  Referred by:  Physician  Date Referred:  01/24/2015 Referred for  ALF Placement   Other Referral:   Interview type:  Patient Other interview type:    PSYCHOSOCIAL DATA Living Status:  FACILITY Admitted from facility:  [facility] Level of care:  Assisted Living Primary support name:  [name] Primary support relationship to patient:  CHILD, ADULT Degree of support available:   Strong    CURRENT CONCERNS Current Concerns  Post-Acute Placement   Other Concerns:    SOCIAL WORK ASSESSMENT / PLAN CSW received referral in order to complete psychosocial assessment. CSW reviewed chart and attempted to meet with patient. Patient sleeping and tech reports periods of confusion so CSW contacted family to complete assessment.    CSW called dtr [name]) on cell phone but no answer. CSW called home phone and son-in-law answered and reports that dtr is not home but that he could answer questions. Son-in-law reports that patient moved into [place] in December 2015 and patient has been doing well. Family reports they met with PMT today and plan on continuing treatment and for patient to return to ALF at [facility]. CSW provided CSW contact information and encouraged family to call with any further concerns.    Patient has been living at [facility] ([facility] known [facility] CSW spoke with Janett Billow at [place] who reports that patient required assistance with bathing and dressing at ALF. Patient liked to stay in bed and required encouragement to get out of bed and get in her wheelchair. Staff would assist with transfers and would wheel patient to the dining room in her wheelchair. Patient was able to feed herself at meals. Patient was not on any  oxygen at ALF.    CSW completed FL2 and will continue to follow.   Assessment/plan status:  Psychosocial Support/Ongoing Assessment of Needs Other assessment/ plan:   Information/referral to community resources:   Will return to ALF    PATIENT'S/FAMILY'S RESPONSE TO PLAN OF CARE: Patient unable to participate in assessment at this time. Family involved in assessment and thanked CSW for calling. Family reports they have been trying to visit often and plan on coming back tomorrow to visit with patient. Family likes placement at ALF and feels that patient is adjusting well. Family reports understanding of CSW role and will call with any further needs.      Sindy Messing, LCSW  (Coverage for [employer])

## 2015-01-24 NOTE — Consult Note (Signed)
Patient [name]      DOB: 1918-08-09      PPI:[id number]     Consult Note from the Palliative Medicine Team at [facility] Requested by: Dr Hal Hope     PCP: Wenda Low, MD Reason for Consultation:Clarification of [place] and options    Phone [phone]  Assessment of patients Current state:  Continued physical, functional and cognitive decline 2/2 to overall failure to thrive/frequent falls, CKD, recurrent infections. Admitted with shortness of breath and suspected  PE  Advanced  directive decisions and anticipatory care needs  Consult is for review of medical treatment options, clarification of goals of care and end of life issues, disposition and options, and symptom recommendation.  This NP Wadie Lessen reviewed medical records, received report from team, assessed the patient and then meet at the patient's bedside along with her daughter [name]  to discuss diagnosis prognosis, [place], EOL wishes disposition and options.  A detailed discussion was had today regarding advanced directives.  Concepts specific to code status, artifical feeding and hydration, continued IV antibiotics and rehospitalization was had.  The difference between a aggressive medical intervention path  and a palliative comfort care path for this patient at this time was had.  Values and goals of care important to patient and family were attempted to be elicited.  Concept of Hospice and Palliative Care were discussed  Natural trajectory and expectations at EOL were discussed.  Questions and concerns addressed.  Hard Choices booklet left for review. Family encouraged to call with questions or concerns.  PMT will continue to support holistically.   Goals of Care: 1.  Code Status: DNR/DNI   2. Scope of Treatment: Continue with current medical interventions and necessary diagnostics until patient  and family take time to process and make more definitive decisions regarding GOC. This NP  will re meet with family tomorrow afternoon  3. Disposition: Pending outcomes   4. Symptom Management:   Weakness:  PT/OT as tolerated, continued medical management of chronic disease    5. Psychosocial:  Emotional support offered to patitn and her family at bedside   Patient Documents Completed or Given: Document Given Completed  Advanced Directives Pkt    MOST X   DNR    Gone from My Sight    Hard Choices X     Brief HPI:  81 y.o. female with history of chronic kidney disease, history of hypertension presently on no medications, history of colon cancer and ovarian cancer in remission was brought to the ER from patient's nursing home after patient was found to be having cough with cold and wheezing over the last 2-3 days with patient's daughter finding the patient has become increasingly drowsy. Patient has had a fall last month and have become more bedbound. Patient also was placed recently on Percocet for pain regarding her osteoarthritis of the hip. In the ER patient was found to be hypoxic with wheezing and cough nonproductive. Chest x-ray does not show anything acute. Patient had mild fever. Troponins were minimally elevated. EKG was showing atypical LBBB. She does not complain of any chest pain. Patient is presently alert and awake oriented to her name and place. Moves all extremities. Patient will be admitted for further management for hypoxia and elevated troponin  ROS: unable to illict due to decreased cognition   PMH:  Past Medical History  Diagnosis Date  . Arthritis   . Anxiety   . Fibromyalgia   . Colon cancer   .  Ovarian cancer      PSH: Past Surgical History  Procedure Laterality Date  . Colon surgery    . Abdominal hysterectomy     I have reviewed the FH and SH and  If appropriate update it with new information. No Known Allergies Scheduled Meds: . ALPRAZolam  0.5 mg Oral Daily  . antiseptic oral rinse  7 mL Mouth Rinse q12n4p  . aspirin  325 mg  Oral Daily  . budesonide (PULMICORT) nebulizer solution  0.25 mg Nebulization BID  . chlorhexidine  15 mL Mouth Rinse BID  . [START ON 01/25/2015] enoxaparin (LOVENOX) injection  30 mg Subcutaneous Q24H  . famotidine  20 mg Oral Daily  . [START ON 01/25/2015] feeding supplement (ENSURE COMPLETE)  237 mL Oral BID BM  . [START ON 01/26/2015] levofloxacin (LEVAQUIN) IV  750 mg Intravenous Q48H  . mirtazapine  7.5 mg Oral QHS  . sertraline  50 mg Oral QHS  . sodium chloride  3 mL Intravenous Q12H  . tobramycin  2 drop Both Eyes QID  . [START ON 01/27/2015] Vitamin D (Ergocalciferol)  50,000 Units Oral Q7 days   Continuous Infusions: . sodium chloride 50 mL/hr at 01/24/15 0021   PRN Meds:.acetaminophen **OR** acetaminophen, hydrALAZINE, nitroGLYCERIN, [DISCONTINUED] ondansetron **OR** ondansetron (ZOFRAN) IV, ondansetron, oxyCODONE-acetaminophen    BP 136/69 mmHg  Pulse 65  Temp(Src) 98.1 F (36.7 C) (Oral)  Resp 18  Wt 52.3 kg (115 lb 4.8 oz)  SpO2 97%   PPS: 30 %   Intake/Output Summary (Last 24 hours) at 01/24/15 2126 Last data filed at 01/24/15 1755  Gross per 24 hour  Intake  352.5 ml  Output    500 ml  Net -147.5 ml     Physical Exam:  General: chronically ill appearing HEENT:  Audible throat secretions Chest:   Decreased in bases CVS: RRR Abdomen: soft NT +BS Neuro: lethargic, intermittently confused  Labs: CBC    Component Value Date/Time   WBC 6.1 01/24/2015 0640   RBC 3.85* 01/24/2015 0640   HGB 11.0* 01/24/2015 0640   HCT 34.8* 01/24/2015 0640   PLT 167 01/24/2015 0640   MCV 90.4 01/24/2015 0640   MCH 28.6 01/24/2015 0640   MCHC 31.6 01/24/2015 0640   RDW 13.0 01/24/2015 0640   LYMPHSABS 1.5 01/24/2015 0640   MONOABS 0.5 01/24/2015 0640   EOSABS 0.0 01/24/2015 0640   BASOSABS 0.0 01/24/2015 0640    BMET    Component Value Date/Time   NA 140 01/24/2015 0640   K 3.4* 01/24/2015 0640   CL 107 01/24/2015 0640   CO2 26 01/24/2015 0640   GLUCOSE  103* 01/24/2015 0640   BUN 23 01/24/2015 0640   CREATININE 1.37* 01/24/2015 0640   CALCIUM 9.5 01/24/2015 0640   GFRNONAA 32* 01/24/2015 0640   GFRAA 37* 01/24/2015 0640    CMP     Component Value Date/Time   NA 140 01/24/2015 0640   K 3.4* 01/24/2015 0640   CL 107 01/24/2015 0640   CO2 26 01/24/2015 0640   GLUCOSE 103* 01/24/2015 0640   BUN 23 01/24/2015 0640   CREATININE 1.37* 01/24/2015 0640   CALCIUM 9.5 01/24/2015 0640   PROT 6.4 01/24/2015 0640   ALBUMIN 3.0* 01/24/2015 0640   AST 36 01/24/2015 0640   ALT 14 01/24/2015 0640   ALKPHOS 75 01/24/2015 0640   BILITOT 0.6 01/24/2015 0640   GFRNONAA 32* 01/24/2015 0640   GFRAA 37* 01/24/2015 0640    Time In Time Out Total Time  Spent with Patient Total Overall Time  1300 1415 70 min 75 min    Greater than 50%  of this time was spent counseling and coordinating care related to the above assessment and plan.   Wadie Lessen NP  Palliative Medicine Team Team Phone # [phone] Pager [phone]  Discussed with Dr Dyann Kief

## 2015-01-24 NOTE — Progress Notes (Signed)
  Echocardiogram 2D Echocardiogram has been performed.  [name] 01/24/2015, 3:17 PM

## 2015-01-24 NOTE — Progress Notes (Signed)
ANTIBIOTIC CONSULT NOTE - INITIAL  Pharmacy Consult for Levaquin Indication: UTI, bronchitis  No Known Allergies  Patient Measurements: Weight: 115 lb 4.8 oz (52.3 kg)  Vital Signs: Temp: 99.2 F (37.3 C) (01/25 0611) Temp Source: Oral (01/25 0611) BP: 167/89 mmHg (01/25 0611) Pulse Rate: 85 (01/25 0611) Intake/Output from previous day: 01/24 0701 - 01/25 0700 In: -  Out: 360 [Urine:360] Intake/Output from this shift:    Labs:  Recent Labs  01/23/15 1925 01/24/15 0640  WBC 7.8 6.1  HGB 11.5* 11.0*  PLT 197 167  CREATININE 1.61* 1.37*   CrCl cannot be calculated (Unknown ideal weight.).   Microbiology: No results found for this or any previous visit (from the past 720 hour(s)).  Medical History: Past Medical History  Diagnosis Date  . Arthritis   . Anxiety   . Fibromyalgia   . Colon cancer   . Ovarian cancer     Medications:  Anti-infectives    Start     Dose/Rate Route Frequency Ordered Stop   01/24/15 0830  levofloxacin (LEVAQUIN) IVPB 750 mg     750 mg100 mL/hr over 90 Minutes Intravenous  Once 01/24/15 0757     01/24/15 0045  doxycycline (VIBRAMYCIN) 100 mg in dextrose 5 % 250 mL IVPB  Status:  Discontinued     100 mg125 mL/hr over 120 Minutes Intravenous 2 times daily 01/24/15 0019 01/24/15 0743     Assessment: 89 yoF admitted 1/24 PM with cough, emesis, SOB and drowsiness.  PMH includes CKD, HTN, colon and ovarian cancer, and OA.  She was initially started on doxycycline for presumed bronchitis, but today, Pharmacy is consulted to dose Levaquin for suspected UTI as well.  1/24 >> Doxycycline >> 1/25 1/25 >> Levaquin >>    Today, 01/24/2015:   Tmax: 99.2  WBCs: remains WNL, 6.1  Renal: SCr improved (1.61 > 1.37), CrCl ~ 20 ml/min   Goal of Therapy:  Appropriate abx dosing, eradication of infection.   Plan:   Levaquin 750mg  IV q48h  Follow up renal function, cultures, and clinical condition.  Gretta Arab PharmD, BCPS Pager  [phone] 01/24/2015 8:58 AM

## 2015-01-24 NOTE — Care Management Note (Addendum)
    Page 1 of 1   01/26/2015     2:19:53 PM CARE MANAGEMENT NOTE 01/26/2015  Patient:  [name], [name]   Account Number:  [id number]  Date Initiated:  01/24/2015  Documentation initiated by:  Dessa Phi  Subjective/Objective Assessment:   81 y/o f admitted w/hypoxia.,UTI.     Action/Plan:   From ALF-[facility].   Anticipated DC Date:  01/26/2015   Anticipated DC Plan:  [facility]SIST[facility] LIVING / [facility]  CM consult      Choice offered to / List presented to:  C-4 Adult Children        HH arranged  HH-1 RN      [place]   Status of service:  Completed, signed off Medicare Important Message given?  YES (If response is "NO", the following Medicare IM given date fields will be blank) Date Medicare IM given:  01/26/2015 Medicare IM given by:  [facility] Date Additional Medicare IM given:   Additional Medicare IM given by:    Discharge Disposition:  [place]  Per UR Regulation:  Reviewed for med. necessity/level of care/duration of stay  If discussed at [place] of Stay Meetings, dates discussed:    Comments:  01/26/15 Dessa Phi RN BSN NCM [phone] Rep [facility] hosp[facility] has accepted patient,will manage dme-hospital bed,02.D/C back to ALF-[facility]-CSW managing.No further needs.  01/25/15 Dessa Phi RN BSN NCM [phone] Referral for home hospice choice.Spoke to [facility] will come to hospital in am 10:30a to talk to dtr.  01/24/15 Dessa Phi RN BSN NCM [phone] Palliative cons, await recommendations.CSW following.

## 2015-01-24 NOTE — Progress Notes (Signed)
[facility] PROGRESS NOTE  [name] JOI:[id number] DOB: [birth year] DOA: 01/23/2015 PCP: Wenda Low, MD  Assessment/Plan: 1-shortness of breath with cough and hypoxia: Appears to be secondary to bronchitis and presumed pulmonary embolism. -At his moment will continue antibiotics using Levaquin (pharmacy to help with those) -Pulmicort, when necessary nebulizers, Mucinex and flutter Valve -Influenza by PCR negative -Given elevated d-dimer is a VQ scan has been done and is suggesting probable pulmonary embolism. After discussing with her daughter she is going to think about use of blood thinners as part of the treatment for this condition. -I CT I do has not been order secondary to renal failure and in order to make the diagnosis more reliable will perform lower extremity duplex -Continue oxygen supplementation  2-acute encephalopathy: Appears to be associated with ongoing bronchitis/UTI versus due to hypoxia. -Will continue treatment with Levaquin for infection -CT of the head was negative for any acute intracranial abnormalities -Will continue oxygen supplementation -Patient mentation has improved.  3-UTI: Continue treatment with Levaquin empirically. -Follow urine cultures  4-elevated troponins: Patient denies any chest pain and a repeat troponins remains elevated at 0.04; most likely secondary to demand ischemia with presumed pulmonary embolism versus as a sitter with her chronic renal failure. -Will continue aspirin at this moment.  5-acute on chronic renal failure (with a stage III at baseline: After gentle hydration her kidney function has returned to baseline and creatinine is 1.37. Will continue to monitor  6-chronic anemia: probably from chronic kidney disease -Stable and without any signs of acute bleeding at this moment.  7-history of ovarian cancer and colon cancer: Appears to be in remission at this moment. Continue outpatient follow-up  8-history of multiple  falls: has been bedbound lately. -Will have physical therapy to evaluation patient. -Aged rates of her hips rule out any acute fracture; but demonstrated severe also arthritis and osteopenia.   9-protein calorie malnutrition: Severe. -Continue feeding supplements and will ask nutritional service to see patient  10-conjunctivitis: Continue Tobrex as initiated at nursing facility. -Improving.  Code Status: DO NOT RESUSCITATE Family Communication: Daughter over the phone [name] [phone]) Disposition Plan: To be determined   Consultants:  None  Procedures:  V/Q scan: Ventilation part unable to be done but there was perfusion mismatch in a small areas of her lungs suggesting possibility of pulmonary embolism.  2-D echo: No wall motion abnormalities. Ejection fraction 50% mild left ventricular hypertrophy pattern; mild tricuspid valve regurgitation.  Lower extremity duplex: Pending  Antibiotics:  Levaquin  HPI/Subjective: Afebrile, currently denies chest pain, nausea/vomiting and abdominal pain. Reports feeling better and is alert, awake and oriented 2.  Objective: Filed Vitals:   01/24/15 1443  BP: 136/69  Pulse: 65  Temp: 98.1 F (36.7 C)  Resp: 18    Intake/Output Summary (Last 24 hours) at 01/24/15 1808 Last data filed at 01/24/15 1755  Gross per 24 hour  Intake  352.5 ml  Output    560 ml  Net -207.5 ml   Filed Weights   01/24/15 0031  Weight: 52.3 kg (115 lb 4.8 oz)    Exam:   General:  Afebrile, feeling slightly better; appetite still poor. AAOX2  Cardiovascular: S1 and S2, no rubs or gallops; positive soft murmur on exam  Respiratory: no wheezing, no crackles, scattered rhonchi   Abdomen: soft, NT, ND, positive BS  Musculoskeletal: trace edema bilaterally,no tenderness on palpation; no cyanosis  Data Reviewed: Basic Metabolic Panel:  Recent Labs Lab 01/23/15 1925 01/24/15 0640  NA 137  140  K 3.6 3.4*  CL 106 107  CO2 26 26   GLUCOSE 87 103*  BUN 28* 23  CREATININE 1.61* 1.37*  CALCIUM 9.5 9.5   Liver Function Tests:  Recent Labs Lab 01/23/15 1925 01/24/15 0640  AST 35 36  ALT 13 14  ALKPHOS 81 75  BILITOT 0.5 0.6  PROT 7.0 6.4  ALBUMIN 3.2* 3.0*   CBC:  Recent Labs Lab 01/23/15 1925 01/24/15 0640  WBC 7.8 6.1  NEUTROABS 5.1 4.1  HGB 11.5* 11.0*  HCT 36.6 34.8*  MCV 90.4 90.4  PLT 197 167   Cardiac Enzymes:  Recent Labs Lab 01/23/15 2018 01/24/15 0100 01/24/15 0640 01/24/15 1203  TROPONINI 0.04* 0.04* 0.04* 0.04*    Studies: Dg Chest 2 View  01/23/2015   CLINICAL DATA:  Pt sts she has had a cough for 2-3 days with some congestion. Pt seems slightly disoriented at times but responsive to some questions and directions. Denies heart/lung/chest hx of issues.  EXAM: CHEST  2 VIEW  COMPARISON:  11/30/2014  FINDINGS: Cardiac silhouette mildly enlarged. No mediastinal or hilar masses. There are linear opacities in the left lower lobe consistent with chronic scarring. There is also chronic bronchitic change most evident in the medial left lower lobe. No lung consolidation or edema. No pleural effusion or pneumothorax.  Bony thorax is demineralized but grossly intact.  IMPRESSION: No acute cardiopulmonary disease.   Electronically Signed   By: Lajean Manes M.D.   On: 01/23/2015 20:43   Dg Pelvis 1-2 Views  01/23/2015   CLINICAL DATA:  Altered mental status and emesis. Decreased oxygen saturation. Fever. Fell 2-4 weeks ago.  EXAM: PELVIS - 1-2 VIEW  COMPARISON:  04/30/2012  FINDINGS: Right hip demonstrates prominent degenerative changes with severe joint space loss, sclerosis and hypertrophic changes on both sides of the joint, and bone lucency and collapse along the superior femoral head with associated remodeling. This appears similar to previous study without significant progression. No definite evidence of any acute fracture and no dislocation. Minimal degenerative changes in the left hip.  Pelvis appears intact. SI joints and symphysis pubis are not displaced. Degenerative changes and scoliosis of the lumbar spine. Surgical clips in the upper pelvis.  IMPRESSION: Severe degenerative changes in the right hip appear similar to prior study. No definite acute fracture.   Electronically Signed   By: Lucienne Capers M.D.   On: 01/23/2015 23:46   Ct Head Wo Contrast  01/23/2015   CLINICAL DATA:  Altered mental status. Fever. History of colon cancer and ovarian cancer. Patient fell 2 weeks ago.  EXAM: CT HEAD WITHOUT CONTRAST  TECHNIQUE: Contiguous axial images were obtained from the base of the skull through the vertex without intravenous contrast.  COMPARISON:  MRI brain 06/27/2011.  CT head 01/24/2005.  FINDINGS: Diffuse cerebral atrophy. Mild ventricular dilatation consistent with central atrophy. Low-attenuation changes throughout the deep white matter consistent with small vessel ischemia. Atrophy and small vessel changes have increased since the previous head CT. No mass effect or midline shift. No abnormal extra-axial fluid collections. Gray-white matter junctions are distinct. Basal cisterns are not effaced. No evidence of acute intracranial hemorrhage. No depressed skull fractures. Diffuse mucosal thickening throughout the paranasal sinuses. Mastoid air cells are not opacified. Vascular calcifications. Prominent degenerative changes at C1 to level.  IMPRESSION: No acute intracranial abnormalities. Chronic atrophy and small vessel ischemic changes.   Electronically Signed   By: Lucienne Capers M.D.   On: 01/23/2015 23:34  Nm Pulmonary Perfusion  01/24/2015   CLINICAL DATA:  Shortness of breath.  Wheezing.  EXAM: NUCLEAR MEDICINE PERFUSION SCAN  TECHNIQUE: Perfusion images were obtained in multiple projections after intravenous injection of radiopharmaceutical.  RADIOPHARMACEUTICALS:  5.0 MCi Tc60m MAA  COMPARISON:  Chest x-ray 01/23/2015.  FINDINGS: Prominent perfusion defect noted in the  right lung base. Multiple tiny are bilateral perfusion defects are present.  IMPRESSION: Prominent perfusion defect right lung base with multiple tiny bilateral perfusion defects suspicious for the possibility of pulmonary embolus. This is a limited exam as ventilation scan could not be performed.   Electronically Signed   By: Marcello Moores  Register   On: 01/24/2015 10:28    Scheduled Meds: . ALPRAZolam  0.5 mg Oral Daily  . antiseptic oral rinse  7 mL Mouth Rinse q12n4p  . aspirin  325 mg Oral Daily  . budesonide (PULMICORT) nebulizer solution  0.25 mg Nebulization BID  . chlorhexidine  15 mL Mouth Rinse BID  . [START ON 01/25/2015] enoxaparin (LOVENOX) injection  30 mg Subcutaneous Q24H  . famotidine  20 mg Oral Daily  . [START ON 01/26/2015] levofloxacin (LEVAQUIN) IV  750 mg Intravenous Q48H  . mirtazapine  7.5 mg Oral QHS  . sertraline  50 mg Oral QHS  . sodium chloride  3 mL Intravenous Q12H  . tobramycin  2 drop Both Eyes QID  . [START ON 01/27/2015] Vitamin D (Ergocalciferol)  50,000 Units Oral Q7 days   Continuous Infusions: . sodium chloride 50 mL/hr at 01/24/15 0021    Principal Problem:   Hypoxia Active Problems:   Elevated troponin   CKD (chronic kidney disease) stage 3, GFR 30-59 ml/min   Chronic anemia   SOB (shortness of breath)   Time spent: 30 minutes   Barton Dubois  [facility] Pager [phone]. If 7PM-7AM, please contact night-coverage at [email or web address], password [facility] 01/24/2015, 6:08 PM  LOS: 1 day

## 2015-01-25 DIAGNOSIS — R531 Weakness: Secondary | ICD-10-CM

## 2015-01-25 DIAGNOSIS — I2699 Other pulmonary embolism without acute cor pulmonale: Secondary | ICD-10-CM

## 2015-01-25 DIAGNOSIS — Z66 Do not resuscitate: Secondary | ICD-10-CM | POA: Diagnosis present

## 2015-01-25 DIAGNOSIS — Z515 Encounter for palliative care: Secondary | ICD-10-CM

## 2015-01-25 LAB — URINE CULTURE: Special Requests: NORMAL

## 2015-01-25 LAB — BASIC METABOLIC PANEL
Anion gap: 6 (ref 5–15)
BUN: 18 mg/dL (ref 6–23)
CO2: 26 mmol/L (ref 19–32)
Calcium: 9.5 mg/dL (ref 8.4–10.5)
Chloride: 109 mmol/L (ref 96–112)
Creatinine, Ser: 1.11 mg/dL — ABNORMAL HIGH (ref 0.50–1.10)
GFR calc non Af Amer: 41 mL/min — ABNORMAL LOW (ref >90)
GFR, EST AFRICAN AMERICAN: 48 mL/min — AB (ref >90)
GLUCOSE: 101 mg/dL — AB (ref 70–99)
Potassium: 3.8 mmol/L (ref 3.5–5.1)
Sodium: 141 mmol/L (ref 135–145)

## 2015-01-25 MED ORDER — ENOXAPARIN SODIUM 60 MG/0.6ML ~~LOC~~ SOLN
50.0000 mg | SUBCUTANEOUS | Status: DC
  Administered 2015-01-25 – 2015-01-26 (×2): 50 mg via SUBCUTANEOUS
  Filled 2015-01-25 (×3): qty 0.6

## 2015-01-25 NOTE — Progress Notes (Signed)
Progress Note from the Palliative Medicine Team at [facility] and Plan  -meet at bedside with daughter to discuss [place] and care needs for discahrge  -decision to shift to full comfort decided , no further diagnostics, artifical feeding or hydration, rehospitalizations, dc Lovenox on dc/continue ASA, convert IV antibiotics to oral as indicated  -discuss with [facility], they agree to accept this patient back with hospice services in place, family requests [facility] at facility recommendation  -MOST form completed; family has original, copy to be scanned    Objective: No Known Allergies Scheduled Meds: . ALPRAZolam  0.5 mg Oral Daily  . antiseptic oral rinse  7 mL Mouth Rinse q12n4p  . aspirin  325 mg Oral Daily  . budesonide (PULMICORT) nebulizer solution  0.25 mg Nebulization BID  . chlorhexidine  15 mL Mouth Rinse BID  . enoxaparin (LOVENOX) injection  50 mg Subcutaneous Q24H  . famotidine  20 mg Oral Daily  . feeding supplement (ENSURE COMPLETE)  237 mL Oral BID BM  . [START ON 01/26/2015] levofloxacin (LEVAQUIN) IV  750 mg Intravenous Q48H  . mirtazapine  7.5 mg Oral QHS  . sertraline  50 mg Oral QHS  . sodium chloride  3 mL Intravenous Q12H  . tobramycin  2 drop Both Eyes QID  . [START ON 01/27/2015] Vitamin D (Ergocalciferol)  50,000 Units Oral Q7 days   Continuous Infusions:  PRN Meds:.acetaminophen **OR** acetaminophen, hydrALAZINE, nitroGLYCERIN, [DISCONTINUED] ondansetron **OR** ondansetron (ZOFRAN) IV, ondansetron, oxyCODONE-acetaminophen  BP 167/73 mmHg  Pulse 85  Temp(Src) 98.4 F (36.9 C) (Oral)  Resp 18  Ht 5\' 2"  (1.575 m)  Wt 52.3 kg (115 lb 4.8 oz)  SpO2 95%   PPS:30 % at best  Pain Score:denies   Intake/Output Summary (Last 24 hours) at 01/25/15 1211 Last data filed at 01/25/15 0852  Gross per 24 hour  Intake    240 ml  Output    400 ml  Net   -160 ml       Physical Exam:  General: ill appearing, NAD HEENT:  Audible  throat secretions, too weak to self clear Chest:   Decreased in bases CVS: RRR Abdomen:soft NT +BS Neuro: weak, intermittently confused  Labs: CBC    Component Value Date/Time   WBC 6.1 01/24/2015 0640   RBC 3.85* 01/24/2015 0640   HGB 11.0* 01/24/2015 0640   HCT 34.8* 01/24/2015 0640   PLT 167 01/24/2015 0640   MCV 90.4 01/24/2015 0640   MCH 28.6 01/24/2015 0640   MCHC 31.6 01/24/2015 0640   RDW 13.0 01/24/2015 0640   LYMPHSABS 1.5 01/24/2015 0640   MONOABS 0.5 01/24/2015 0640   EOSABS 0.0 01/24/2015 0640   BASOSABS 0.0 01/24/2015 0640    BMET    Component Value Date/Time   NA 140 01/24/2015 0640   K 3.4* 01/24/2015 0640   CL 107 01/24/2015 0640   CO2 26 01/24/2015 0640   GLUCOSE 103* 01/24/2015 0640   BUN 23 01/24/2015 0640   CREATININE 1.37* 01/24/2015 0640   CALCIUM 9.5 01/24/2015 0640   GFRNONAA 32* 01/24/2015 0640   GFRAA 37* 01/24/2015 0640    CMP     Component Value Date/Time   NA 140 01/24/2015 0640   K 3.4* 01/24/2015 0640   CL 107 01/24/2015 0640   CO2 26 01/24/2015 0640   GLUCOSE 103* 01/24/2015 0640   BUN 23 01/24/2015 0640   CREATININE 1.37* 01/24/2015 0640   CALCIUM 9.5 01/24/2015 0640   PROT  6.4 01/24/2015 0640   ALBUMIN 3.0* 01/24/2015 0640   AST 36 01/24/2015 0640   ALT 14 01/24/2015 0640   ALKPHOS 75 01/24/2015 0640   BILITOT 0.6 01/24/2015 0640   GFRNONAA 32* 01/24/2015 0640   GFRAA 37* 01/24/2015 0640      Time In Time Out Total Time Spent with Patient Total Overall Time  1515 1600 45 min 45 min    Greater than 50%  of this time was spent counseling and coordinating care related to the above assessment and plan.  Wadie Lessen NP  Palliative Medicine Team Team Phone # [phone] Pager [phone]  Discussed with Dr Tyrell Antonio 1

## 2015-01-25 NOTE — Evaluation (Signed)
SLP Cancellation Note  Patient Details Name: [name] MRN: [id number] DOB: [birth year]   Cancelled treatment:       Reason Eval/Treat Not Completed:  (order received to cancel swallow evaluation, please reorder if desire)   Luanna Salk, [facility] SLP [phone]

## 2015-01-25 NOTE — Progress Notes (Signed)
ANTICOAGULATION CONSULT NOTE - Initial Consult  Pharmacy Consult for Enoxaparin Indication: pulmonary embolus  No Known Allergies  Patient Measurements: Height: 5\' 2"  (157.5 cm) Weight: 115 lb 4.8 oz (52.3 kg) IBW/kg (Calculated) : 50.1  Vital Signs: Temp: 98.4 F (36.9 C) (01/26 0455) Temp Source: Oral (01/26 0455) BP: 167/73 mmHg (01/26 0455) Pulse Rate: 85 (01/26 0455)  Labs:  Recent Labs  01/23/15 1925  01/24/15 0100 01/24/15 0640 01/24/15 1203  HGB 11.5*  --   --  11.0*  --   HCT 36.6  --   --  34.8*  --   PLT 197  --   --  167  --   CREATININE 1.61*  --   --  1.37*  --   TROPONINI  --   < > 0.04* 0.04* 0.04*  < > = values in this interval not displayed.  Estimated Creatinine Clearance: 19.9 mL/min (by C-G formula based on Cr of 1.37).   Medical History: Past Medical History  Diagnosis Date  . Arthritis   . Anxiety   . Fibromyalgia   . Colon cancer   . Ovarian cancer     Medications:  Scheduled:  . ALPRAZolam  0.5 mg Oral Daily  . antiseptic oral rinse  7 mL Mouth Rinse q12n4p  . aspirin  325 mg Oral Daily  . budesonide (PULMICORT) nebulizer solution  0.25 mg Nebulization BID  . chlorhexidine  15 mL Mouth Rinse BID  . enoxaparin (LOVENOX) injection  50 mg Subcutaneous Q24H  . famotidine  20 mg Oral Daily  . feeding supplement (ENSURE COMPLETE)  237 mL Oral BID BM  . [START ON 01/26/2015] levofloxacin (LEVAQUIN) IV  750 mg Intravenous Q48H  . mirtazapine  7.5 mg Oral QHS  . sertraline  50 mg Oral QHS  . sodium chloride  3 mL Intravenous Q12H  . tobramycin  2 drop Both Eyes QID  . [START ON 01/27/2015] Vitamin D (Ergocalciferol)  50,000 Units Oral Q7 days   Infusions:    Assessment: 51 yoF admitted 1/24 PM with cough, emesis, SOB and drowsiness. PMH includes CKD, HTN, colon and ovarian cancer, and OA. She has been started on antibiotic for bronchitis and prophylactic Enoxaparin for VTE prophylaxis.  Her d-dimer is elevated and VQ scan with  probable pulmonary embolism.  Pharmacy is consulted to dose Enoxaparin for PE.   SCr 1.37, CrCl ~ 20 ml/min  CBC: Hgb 11, Plt 167  Last Lovenox 30mg  prophylactic dose given 1/25   Goal of Therapy:  Anti-Xa level 0.6-1 units/ml 4hrs after LMWH dose given Monitor platelets by anticoagulation protocol: Yes   Plan:   Enoxaparin 50mg  SQ q24h  Follow up renal function and CBC.  Monitor for bleeding.  Follow up plan for long-term anticoagulation after palliative care discussion of GOC.  Gretta Arab PharmD, BCPS Pager [phone] 01/25/2015 11:18 AM

## 2015-01-25 NOTE — Progress Notes (Signed)
[facility] PROGRESS NOTE  [name] GMW:[id number] DOB: [birth year] DOA: 01/23/2015 PCP: Wenda Low, MD  Assessment/Plan: 1-Acute Hypoxic respiratory failure:  Presume PE.;  -Appears to be secondary to bronchitis and presumed pulmonary embolism. -At his moment will continue antibiotics using Levaquin (pharmacy to help with those) -Pulmicort, when necessary nebulizers, Mucinex and flutter Valve -Influenza by PCR negative -Given elevated d-dimer is a VQ scan has been done and is suggesting probable pulmonary embolism. After discussing with her daughter she is going to think about use of blood thinners as part of the treatment for this condition. -I CT I do has not been order secondary to renal failure.  -doppler preliminary report negative.,  -discussed with radiologist he is concern with PE by V -Q scan.  -I discussed with Daughter, she agree with Lovenox therapeutic dose for now, until she decide regarding goals of care. She will continue conversation  with Palliative care. She would not want her mother to go to SNF and get more weak. I have ask SW to find out if ALF will be able to provide care, coumadin and lovenox injection if needed.  -Continue oxygen supplementation  2-Acute encephalopathy: Appears to be associated with ongoing bronchitis/UTI versus due to hypoxia. -Will continue treatment with Levaquin for infection -CT of the head was negative for any acute intracranial abnormalities -Will continue oxygen supplementation -Patient mentation has improved.  3-UTI: Continue treatment with Levaquin empirically. -urine cultures multiple bacterial morphotype.   4-Elevated troponins: Patient denies any chest pain and a repeat troponins remains elevated at 0.04; most likely secondary to demand ischemia with presumed pulmonary embolism versus as a sitter with her chronic renal failure. -Will continue aspirin at this moment.  5-Acute on chronic renal failure (with a stage III at  baseline: After gentle hydration her kidney function has returned to baseline and creatinine is 1.37. Will continue to monitor. -B-met pending for this morning.   6-Chronic anemia: probably from chronic kidney disease -Stable and without any signs of acute bleeding at this moment.  7-History of ovarian cancer and colon cancer: Appears to be in remission at this moment. Continue outpatient follow-up  8-history of multiple falls: has been bedbound lately. -Will have physical therapy to evaluation patient. -Aged rates of her hips rule out any acute fracture; but demonstrated severe also arthritis and osteopenia.   9-protein calorie malnutrition: Severe. -Continue feeding supplements and will ask nutritional service to see patient  10-conjunctivitis: Continue Tobrex as initiated at nursing facility. -Improving.  Code Status: DO NOT RESUSCITATE Family Communication: Daughter over the phone [name] [phone]) Disposition Plan: To be determined   Consultants:  None  Procedures:  V/Q scan: Ventilation part unable to be done but there was perfusion mismatch in a small areas of her lungs suggesting possibility of pulmonary embolism.  2-D echo: No wall motion abnormalities. Ejection fraction 50% mild left ventricular hypertrophy pattern; mild tricuspid valve regurgitation.  Lower extremity duplex; preliminary report negative for DVT  Antibiotics:  Levaquin  HPI/Subjective: Denies chest pain, she is feeling weak.   Objective: Filed Vitals:   01/25/15 0455  BP: 167/73  Pulse: 85  Temp: 98.4 F (36.9 C)  Resp: 18    Intake/Output Summary (Last 24 hours) at 01/25/15 1035 Last data filed at 01/25/15 0852  Gross per 24 hour  Intake    240 ml  Output    400 ml  Net   -160 ml   Filed Weights   01/24/15 0031  Weight: 52.3 kg (115 lb  4.8 oz)    Exam:   General:  Afebrile, feeling slightly better; appetite still poor. AAOX2  Cardiovascular: S1 and S2, no rubs or  gallops; positive soft murmur on exam  Respiratory: no wheezing, no crackles, scattered rhonchi   Abdomen: soft, NT, ND, positive BS  Musculoskeletal: trace edema bilaterally,no tenderness on palpation; no cyanosis  Data Reviewed: Basic Metabolic Panel:  Recent Labs Lab 01/23/15 1925 01/24/15 0640  NA 137 140  K 3.6 3.4*  CL 106 107  CO2 26 26  GLUCOSE 87 103*  BUN 28* 23  CREATININE 1.61* 1.37*  CALCIUM 9.5 9.5   Liver Function Tests:  Recent Labs Lab 01/23/15 1925 01/24/15 0640  AST 35 36  ALT 13 14  ALKPHOS 81 75  BILITOT 0.5 0.6  PROT 7.0 6.4  ALBUMIN 3.2* 3.0*   CBC:  Recent Labs Lab 01/23/15 1925 01/24/15 0640  WBC 7.8 6.1  NEUTROABS 5.1 4.1  HGB 11.5* 11.0*  HCT 36.6 34.8*  MCV 90.4 90.4  PLT 197 167   Cardiac Enzymes:  Recent Labs Lab 01/23/15 2018 01/24/15 0100 01/24/15 0640 01/24/15 1203  TROPONINI 0.04* 0.04* 0.04* 0.04*    Studies: Dg Chest 2 View  01/23/2015   CLINICAL DATA:  Pt sts she has had a cough for 2-3 days with some congestion. Pt seems slightly disoriented at times but responsive to some questions and directions. Denies heart/lung/chest hx of issues.  EXAM: CHEST  2 VIEW  COMPARISON:  11/30/2014  FINDINGS: Cardiac silhouette mildly enlarged. No mediastinal or hilar masses. There are linear opacities in the left lower lobe consistent with chronic scarring. There is also chronic bronchitic change most evident in the medial left lower lobe. No lung consolidation or edema. No pleural effusion or pneumothorax.  Bony thorax is demineralized but grossly intact.  IMPRESSION: No acute cardiopulmonary disease.   Electronically Signed   By: Lajean Manes M.D.   On: 01/23/2015 20:43   Dg Pelvis 1-2 Views  01/23/2015   CLINICAL DATA:  Altered mental status and emesis. Decreased oxygen saturation. Fever. Fell 2-4 weeks ago.  EXAM: PELVIS - 1-2 VIEW  COMPARISON:  04/30/2012  FINDINGS: Right hip demonstrates prominent degenerative changes with  severe joint space loss, sclerosis and hypertrophic changes on both sides of the joint, and bone lucency and collapse along the superior femoral head with associated remodeling. This appears similar to previous study without significant progression. No definite evidence of any acute fracture and no dislocation. Minimal degenerative changes in the left hip. Pelvis appears intact. SI joints and symphysis pubis are not displaced. Degenerative changes and scoliosis of the lumbar spine. Surgical clips in the upper pelvis.  IMPRESSION: Severe degenerative changes in the right hip appear similar to prior study. No definite acute fracture.   Electronically Signed   By: Lucienne Capers M.D.   On: 01/23/2015 23:46   Ct Head Wo Contrast  01/23/2015   CLINICAL DATA:  Altered mental status. Fever. History of colon cancer and ovarian cancer. Patient fell 2 weeks ago.  EXAM: CT HEAD WITHOUT CONTRAST  TECHNIQUE: Contiguous axial images were obtained from the base of the skull through the vertex without intravenous contrast.  COMPARISON:  MRI brain 06/27/2011.  CT head 01/24/2005.  FINDINGS: Diffuse cerebral atrophy. Mild ventricular dilatation consistent with central atrophy. Low-attenuation changes throughout the deep white matter consistent with small vessel ischemia. Atrophy and small vessel changes have increased since the previous head CT. No mass effect or midline shift. No abnormal extra-axial  fluid collections. Gray-white matter junctions are distinct. Basal cisterns are not effaced. No evidence of acute intracranial hemorrhage. No depressed skull fractures. Diffuse mucosal thickening throughout the paranasal sinuses. Mastoid air cells are not opacified. Vascular calcifications. Prominent degenerative changes at C1 to level.  IMPRESSION: No acute intracranial abnormalities. Chronic atrophy and small vessel ischemic changes.   Electronically Signed   By: Lucienne Capers M.D.   On: 01/23/2015 23:34   Nm Pulmonary  Perfusion  01/24/2015   CLINICAL DATA:  Shortness of breath.  Wheezing.  EXAM: NUCLEAR MEDICINE PERFUSION SCAN  TECHNIQUE: Perfusion images were obtained in multiple projections after intravenous injection of radiopharmaceutical.  RADIOPHARMACEUTICALS:  5.0 MCi Tc59mMAA  COMPARISON:  Chest x-ray 01/23/2015.  FINDINGS: Prominent perfusion defect noted in the right lung base. Multiple tiny are bilateral perfusion defects are present.  IMPRESSION: Prominent perfusion defect right lung base with multiple tiny bilateral perfusion defects suspicious for the possibility of pulmonary embolus. This is a limited exam as ventilation scan could not be performed.   Electronically Signed   By: TMarcello Moores Register   On: 01/24/2015 10:28    Scheduled Meds: . ALPRAZolam  0.5 mg Oral Daily  . antiseptic oral rinse  7 mL Mouth Rinse q12n4p  . aspirin  325 mg Oral Daily  . budesonide (PULMICORT) nebulizer solution  0.25 mg Nebulization BID  . chlorhexidine  15 mL Mouth Rinse BID  . enoxaparin (LOVENOX) injection  30 mg Subcutaneous Q24H  . famotidine  20 mg Oral Daily  . feeding supplement (ENSURE COMPLETE)  237 mL Oral BID BM  . [START ON 01/26/2015] levofloxacin (LEVAQUIN) IV  750 mg Intravenous Q48H  . mirtazapine  7.5 mg Oral QHS  . sertraline  50 mg Oral QHS  . sodium chloride  3 mL Intravenous Q12H  . tobramycin  2 drop Both Eyes QID  . [START ON 01/27/2015] Vitamin D (Ergocalciferol)  50,000 Units Oral Q7 days   Continuous Infusions:    Principal Problem:   Hypoxia Active Problems:   Elevated troponin   CKD (chronic kidney disease) stage 3, GFR 30-59 ml/min   Chronic anemia   SOB (shortness of breath)   UTI (urinary tract infection)   Time spent: 30 minutes   [name], [facility] Pager [phone] If 7PM-7AM, please contact night-coverage at [email or web address], password [facility]1/26/2016, 10:35 AM  LOS: 2 days

## 2015-01-25 NOTE — Progress Notes (Signed)
INITIAL NUTRITION ASSESSMENT  DOCUMENTATION CODES Per approved criteria  -Not Applicable   INTERVENTION: Continue Ensure Complete po BID, each supplement provides 350 kcal and 13 grams of protein RD to continue to follow  NUTRITION DIAGNOSIS: Inadequate oral intake related to advanced age as evidenced by Poor PO intake <25%.   Goal: Pt to meet >/= 90% of their estimated nutrition needs   Monitor:  PO and supplemental intake, weight, labs, I/O's  Reason for Assessment: Consult for nutritional assessment  Admitting Dx: Hypoxia  ASSESSMENT: 81 y.o. female with history of chronic kidney disease, history of hypertension presently on no medications, history of colon cancer and ovarian cancer in remission was brought to the ER from patient's nursing home after patient was found to be having cough with cold and wheezing over the last 2-3 days with patient's daughter finding the patient has become increasingly drowsy.   Pt asleep at time of visit, unable to wake. No family present in room. Palliative consult pending for goals of care.  Pt with lunch tray, barely touched, seemed as though patient only consumed a little juice and about 10-15% of her Ensure supplement.  Weight history not available. Pt with noticeable signs of depletion in clavicle, acromion and temporal regions. Suspect some degree of malnutrition d/t noticeable wasting and poor PO intake.  Labs reviewed: Low K Elevated Creatinine  Height: Ht Readings from Last 1 Encounters:  01/25/15 5\' 2"  (1.575 m)    Weight: Wt Readings from Last 1 Encounters:  01/24/15 115 lb 4.8 oz (52.3 kg)    Ideal Body Weight: 110 lb  % Ideal Body Weight: 105%  Wt Readings from Last 10 Encounters:  01/24/15 115 lb 4.8 oz (52.3 kg)    Usual Body Weight: unable to obtain at this time  % Usual Body Weight: NA  BMI:  Body mass index is 21.08 kg/(m^2).  Estimated Nutritional Needs: Kcal: 1300-1500 Protein: 65-75g Fluid:  1.5L/day  Skin: intact  Diet Order: Diet Heart  EDUCATION NEEDS: -No education needs identified at this time   Intake/Output Summary (Last 24 hours) at 01/25/15 1101 Last data filed at 01/25/15 0852  Gross per 24 hour  Intake    240 ml  Output    400 ml  Net   -160 ml    Last BM: PTA  Labs:   Recent Labs Lab 01/23/15 1925 01/24/15 0640  NA 137 140  K 3.6 3.4*  CL 106 107  CO2 26 26  BUN 28* 23  CREATININE 1.61* 1.37*  CALCIUM 9.5 9.5  GLUCOSE 87 103*    CBG (last 3)  No results for input(s): GLUCAP in the last 72 hours.  Scheduled Meds: . ALPRAZolam  0.5 mg Oral Daily  . antiseptic oral rinse  7 mL Mouth Rinse q12n4p  . aspirin  325 mg Oral Daily  . budesonide (PULMICORT) nebulizer solution  0.25 mg Nebulization BID  . chlorhexidine  15 mL Mouth Rinse BID  . famotidine  20 mg Oral Daily  . feeding supplement (ENSURE COMPLETE)  237 mL Oral BID BM  . [START ON 01/26/2015] levofloxacin (LEVAQUIN) IV  750 mg Intravenous Q48H  . mirtazapine  7.5 mg Oral QHS  . sertraline  50 mg Oral QHS  . sodium chloride  3 mL Intravenous Q12H  . tobramycin  2 drop Both Eyes QID  . [START ON 01/27/2015] Vitamin D (Ergocalciferol)  50,000 Units Oral Q7 days    Continuous Infusions:   Past Medical History  Diagnosis Date  .  Arthritis   . Anxiety   . Fibromyalgia   . Colon cancer   . Ovarian cancer     Past Surgical History  Procedure Laterality Date  . Colon surgery    . Abdominal hysterectomy      Clayton Bibles, MS, RD, LDN Pager: [phone] After Hours Pager: [phone]

## 2015-01-25 NOTE — Progress Notes (Signed)
*  PRELIMINARY RESULTS* Vascular Ultrasound Lower extremity venous duplex has been completed.  Preliminary findings: no evidence of DVT in visualized veins.   Landry Mellow, RDMS, RVT  01/25/2015, 10:34 AM

## 2015-01-26 MED ORDER — HYDROCODONE-ACETAMINOPHEN 5-325 MG PO TABS: ORAL_TABLET | ORAL | Status: DC

## 2015-01-26 MED ORDER — GUAIFENESIN ER 600 MG PO TB12: 1200.0000 mg | ORAL_TABLET | Freq: Two times a day (BID) | ORAL | Status: DC

## 2015-01-26 MED ORDER — ALPRAZOLAM 0.25 MG PO TABS: 0.5000 mg | ORAL_TABLET | ORAL | Status: DC

## 2015-01-26 MED ORDER — SENNOSIDES-DOCUSATE SODIUM 8.6-50 MG PO TABS: 1.0000 | ORAL_TABLET | Freq: Every day | ORAL | Status: AC

## 2015-01-26 MED ORDER — LEVOFLOXACIN 500 MG PO TABS
500.0000 mg | ORAL_TABLET | Freq: Every day | ORAL | Status: DC
Start: 2015-01-26 — End: 2015-11-20

## 2015-01-26 NOTE — Progress Notes (Signed)
PT Cancellation Note  Patient Details Name: LARETTA PYATT MRN: [id number] DOB: Dec 05, 1918   Cancelled Treatment:    Reason Eval/Treat Not Completed: Fatigue/lethargy limiting ability to participate (Pt declined PT. Noted pt to DC with Hospice today. Will sign off. )   Philomena Doheny 01/26/2015, 1:15 PM  [phone]

## 2015-01-26 NOTE — Progress Notes (Signed)
Patient is set to discharge back to [facility] today. CSW confirmed with Debbie @ ALF that they could take patient back. Patient & daughter, [name] aware. Discharge packet given to RN, Manuela Schwartz. [facility] called for transport.     Raynaldo Opitz, [facility] Clinical Social Worker cell #: [phone]

## 2015-01-26 NOTE — Progress Notes (Signed)
OT Cancellation Note  Patient Details Name: [name] MRN: [id number] DOB: 01-20-1918   Cancelled Treatment:     Reason evaluation/assessment not completed: Orders received and chart reviewed for OT, per Palliative Notes in chart, pt is full comfort measures with plans to d/c home w/ Hospice as plan of care. Will sign off acute OT at this time.   Percell Miller Beth Dixon, OTR/L 01/26/2015, 8:09 AM

## 2015-01-26 NOTE — Discharge Summary (Signed)
[name], is a 81 y.o. female  DOB 07-12-1918  MRN [id number].  Admission date:  01/23/2015  Admitting Physician  Rise Patience, MD  Discharge Date:  01/26/2015   Primary MD  Wenda Low, MD  Recommendations for primary care physician for things to follow:   Keep patient comfortable.   Admission Diagnosis  Cough [R05] Fall [W19.XXXA] Hypoxia [R09.02] Elevated troponin [R79.89]   Discharge Diagnosis  Cough [R05] Fall [W19.XXXA] Hypoxia [R09.02] Elevated troponin [R79.89]    Principal Problem:   Hypoxia Active Problems:   Elevated troponin   CKD (chronic kidney disease) stage 3, GFR 30-59 ml/min   Chronic anemia   SOB (shortness of breath)   UTI (urinary tract infection)   DNR (do not resuscitate)   Weakness generalized   Palliative care encounter      Past Medical History  Diagnosis Date  . Arthritis   . Anxiety   . Fibromyalgia   . Colon cancer   . Ovarian cancer     Past Surgical History  Procedure Laterality Date  . Colon surgery    . Abdominal hysterectomy         History of present illness and  Hospital Course:     Kindly see H&P for history of present illness and admission details, please review complete Labs, Consult reports and Test reports for all details in brief  HPI  from the history and physical done on the day of admission    [name] is a pleasant 81 y.o. female with history of chronic kidney disease, history of hypertension presently on no medications for it, history of colon cancer and ovarian cancer in remission, who was brought to the ER from patient's nursing home after patient was found to be having cough with cold like symptoms and wheezing over the last 2-3 days prior to admission with patient's daughter finding the patient had become  increasingly drowsy. Patient was hypoxic, and this was felt to be due to acute bronchitis with question of PE as V/Q scan showed "Prominent perfusion defect right lung base with multiple tiny bilateral perfusion defects suspicious for the possibility of pulmonary embolus. This is a limited exam as ventilation scan could not be performed".Her CXR suggested no acute abnormality. White count was 7800. Patient was placed on Levaquin and Lovenox but family decided to focus on comfort after meeting with Palliative care. They decided against anticoagulation but were OK with completing course of antibiotics(4 more days of Levaquin to go). Patient will continue on her other medications and will have Oxygen as needed. She has been referred to hospice which will be set up at her ALF. She is DNR/DNI.   Discharge Condition: guarded.   Follow UP  Follow-up Information    Please follow up.   Why:  Home hospice   Contact information:   [address] [phone] [address] [place] [address] [phone]        Discharge Instructions  and  Discharge  Medications        Discharge Instructions    Diet - low sodium heart healthy    Complete by:  As directed      Increase activity slowly    Complete by:  As directed             Medication List    STOP taking these medications        oxyCODONE-acetaminophen 5-325 MG per tablet  Commonly known as:  PERCOCET/ROXICET      TAKE these medications        ALPRAZolam 0.25 MG tablet  Commonly known as:  XANAX  Take 2 tablets (0.5 mg total) by mouth every morning. For nerves     Calcium Carbonate-Vitamin D 600-200 MG-UNIT Tabs  Take 1 tablet by mouth daily before lunch.     celecoxib 100 MG capsule  Commonly known as:  CELEBREX  Take one pill twice a day for pain     cholecalciferol 1000 UNITS tablet  Commonly known as:  VITAMIN D  Take 1,000 Units by mouth daily.     guaiFENesin 600 MG 12 hr tablet  Commonly known as:   MUCINEX  Take 2 tablets (1,200 mg total) by mouth 2 (two) times daily.     HYDROcodone-acetaminophen 5-325 MG per tablet  Commonly known as:  NORCO/VICODIN  Take one every 6 hours for pain not helped by celebrex or tylenol     levofloxacin 500 MG tablet  Commonly known as:  LEVAQUIN  Take 1 tablet (500 mg total) by mouth daily.     mirtazapine 7.5 MG tablet  Commonly known as:  REMERON  Take 7.5 mg by mouth at bedtime.     ondansetron 8 MG tablet  Commonly known as:  ZOFRAN  Take 8 mg by mouth every 8 (eight) hours as needed for nausea or vomiting.     ranitidine 150 MG capsule  Commonly known as:  ZANTAC  Take 150 mg by mouth 2 (two) times daily.     senna-docusate 8.6-50 MG per tablet  Commonly known as:  Senokot-S  Take 1 tablet by mouth daily.     sertraline 50 MG tablet  Commonly known as:  ZOLOFT  Take 50 mg by mouth at bedtime.     tobramycin 0.3 % ophthalmic solution  Commonly known as:  TOBREX  Place 2 drops into both eyes 4 (four) times daily.     Vitamin D (Ergocalciferol) 50000 UNITS Caps capsule  Commonly known as:  DRISDOL  Take 50,000 Units by mouth every 7 (seven) days.          Diet and Activity recommendation: See Discharge Instructions above   Consults obtained - Palliative Care.   Major procedures and Radiology Reports - PLEASE review detailed and final reports for all details, in brief -    Dg Chest 2 View  01/23/2015   CLINICAL DATA:  Pt sts she has had a cough for 2-3 days with some congestion. Pt seems slightly disoriented at times but responsive to some questions and directions. Denies heart/lung/chest hx of issues.  EXAM: CHEST  2 VIEW  COMPARISON:  11/30/2014  FINDINGS: Cardiac silhouette mildly enlarged. No mediastinal or hilar masses. There are linear opacities in the left lower lobe consistent with chronic scarring. There is also chronic bronchitic change most evident in the medial left lower lobe. No lung consolidation or edema. No  pleural effusion or pneumothorax.  Bony thorax is demineralized but grossly intact.  IMPRESSION: No acute  cardiopulmonary disease.   Electronically Signed   By: Lajean Manes M.D.   On: 01/23/2015 20:43   Dg Pelvis 1-2 Views  01/23/2015   CLINICAL DATA:  Altered mental status and emesis. Decreased oxygen saturation. Fever. Fell 2-4 weeks ago.  EXAM: PELVIS - 1-2 VIEW  COMPARISON:  04/30/2012  FINDINGS: Right hip demonstrates prominent degenerative changes with severe joint space loss, sclerosis and hypertrophic changes on both sides of the joint, and bone lucency and collapse along the superior femoral head with associated remodeling. This appears similar to previous study without significant progression. No definite evidence of any acute fracture and no dislocation. Minimal degenerative changes in the left hip. Pelvis appears intact. SI joints and symphysis pubis are not displaced. Degenerative changes and scoliosis of the lumbar spine. Surgical clips in the upper pelvis.  IMPRESSION: Severe degenerative changes in the right hip appear similar to prior study. No definite acute fracture.   Electronically Signed   By: Lucienne Capers M.D.   On: 01/23/2015 23:46   Ct Head Wo Contrast  01/23/2015   CLINICAL DATA:  Altered mental status. Fever. History of colon cancer and ovarian cancer. Patient fell 2 weeks ago.  EXAM: CT HEAD WITHOUT CONTRAST  TECHNIQUE: Contiguous axial images were obtained from the base of the skull through the vertex without intravenous contrast.  COMPARISON:  MRI brain 06/27/2011.  CT head 01/24/2005.  FINDINGS: Diffuse cerebral atrophy. Mild ventricular dilatation consistent with central atrophy. Low-attenuation changes throughout the deep white matter consistent with small vessel ischemia. Atrophy and small vessel changes have increased since the previous head CT. No mass effect or midline shift. No abnormal extra-axial fluid collections. Gray-white matter junctions are distinct. Basal  cisterns are not effaced. No evidence of acute intracranial hemorrhage. No depressed skull fractures. Diffuse mucosal thickening throughout the paranasal sinuses. Mastoid air cells are not opacified. Vascular calcifications. Prominent degenerative changes at C1 to level.  IMPRESSION: No acute intracranial abnormalities. Chronic atrophy and small vessel ischemic changes.   Electronically Signed   By: Lucienne Capers M.D.   On: 01/23/2015 23:34   Nm Pulmonary Perfusion  01/24/2015   CLINICAL DATA:  Shortness of breath.  Wheezing.  EXAM: NUCLEAR MEDICINE PERFUSION SCAN  TECHNIQUE: Perfusion images were obtained in multiple projections after intravenous injection of radiopharmaceutical.  RADIOPHARMACEUTICALS:  5.0 MCi Tc14m MAA  COMPARISON:  Chest x-ray 01/23/2015.  FINDINGS: Prominent perfusion defect noted in the right lung base. Multiple tiny are bilateral perfusion defects are present.  IMPRESSION: Prominent perfusion defect right lung base with multiple tiny bilateral perfusion defects suspicious for the possibility of pulmonary embolus. This is a limited exam as ventilation scan could not be performed.   Electronically Signed   By: Marcello Moores  Register   On: 01/24/2015 10:28    Micro Results     Recent Results (from the past 240 hour(s))  Urine culture     Status: None   Collection Time: 01/23/15 10:54 PM  Result Value Ref Range Status   Specimen Description URINE, RANDOM  Final   Special Requests Normal  Final   Colony Count   Final    >=100,000 COLONIES/ML Performed at [facility]    Culture   Final    Multiple bacterial morphotypes present, none predominant. Suggest appropriate recollection if clinically indicated. Performed at [facility]    Report Status 01/25/2015 FINAL  Final       Today   Subjective:   Soniyah Mcglory today has no headache,no chest  abdominal pain,no new weakness tingling or numbness, feels much better wants to go home today.   Objective:    Blood pressure 154/78, pulse 80, temperature 97.2 F (36.2 C), temperature source Oral, resp. rate 18, height 5\' 2"  (1.575 m), weight 52.3 kg (115 lb 4.8 oz), SpO2 98 %.   Intake/Output Summary (Last 24 hours) at 01/26/15 1256 Last data filed at 01/26/15 0844  Gross per 24 hour  Intake    490 ml  Output    750 ml  Net   -260 ml    Exam Awake Alert, Oriented x 3, No new F.N deficits, Normal affect [place].AT,PERRAL Supple Neck,No JVD, No cervical lymphadenopathy appriciated.  Symmetrical Chest wall movement, Good air movement bilaterally, CTAB RRR,No Gallops,Rubs or new Murmurs, No Parasternal Heave +ve B.Sounds, Abd Soft, Non tender, No organomegaly appriciated, No rebound -guarding or rigidity. No Cyanosis, Clubbing or edema, No new Rash or bruise  Data Review   CBC w Diff:  Lab Results  Component Value Date   WBC 6.1 01/24/2015   HGB 11.0* 01/24/2015   HCT 34.8* 01/24/2015   PLT 167 01/24/2015   LYMPHOPCT 24 01/24/2015   MONOPCT 9 01/24/2015   EOSPCT 0 01/24/2015   BASOPCT 1 01/24/2015    CMP:  Lab Results  Component Value Date   NA 141 01/25/2015   K 3.8 01/25/2015   CL 109 01/25/2015   CO2 26 01/25/2015   BUN 18 01/25/2015   CREATININE 1.11* 01/25/2015   PROT 6.4 01/24/2015   ALBUMIN 3.0* 01/24/2015   BILITOT 0.6 01/24/2015   ALKPHOS 75 01/24/2015   AST 36 01/24/2015   ALT 14 01/24/2015  .   Total Time in preparing paper work, data evaluation and todays exam - 35 minutes  Teleah Villamar M.D on 01/26/2015 at 12:56 PM  [facility] Office  [phone]

## 2015-01-26 NOTE — Progress Notes (Signed)
Attempted to call report to [facility] x 3 unable to speak to a nurse.

## 2015-01-28 ENCOUNTER — Emergency Department (HOSPITAL_COMMUNITY): Payer: Medicare Other

## 2015-01-28 ENCOUNTER — Encounter (HOSPITAL_COMMUNITY): Payer: Self-pay | Admitting: Emergency Medicine

## 2015-01-28 ENCOUNTER — Emergency Department (HOSPITAL_COMMUNITY)
Admission: EM | Admit: 2015-01-28 | Discharge: 2015-01-28 | Disposition: A | Discharge status: Home / Self Care | Payer: Medicare Other | Attending: Emergency Medicine | Admitting: Emergency Medicine

## 2015-01-28 DIAGNOSIS — Z8543 Personal history of malignant neoplasm of ovary: Secondary | ICD-10-CM | POA: Insufficient documentation

## 2015-01-28 DIAGNOSIS — Y92128 Other place in nursing home as the place of occurrence of the external cause: Secondary | ICD-10-CM | POA: Insufficient documentation

## 2015-01-28 DIAGNOSIS — Z791 Long term (current) use of non-steroidal anti-inflammatories (NSAID): Secondary | ICD-10-CM | POA: Insufficient documentation

## 2015-01-28 DIAGNOSIS — W19XXXA Unspecified fall, initial encounter: Secondary | ICD-10-CM

## 2015-01-28 DIAGNOSIS — W1839XA Other fall on same level, initial encounter: Secondary | ICD-10-CM | POA: Diagnosis not present

## 2015-01-28 DIAGNOSIS — Y9301 Activity, walking, marching and hiking: Secondary | ICD-10-CM | POA: Diagnosis not present

## 2015-01-28 DIAGNOSIS — M797 Fibromyalgia: Secondary | ICD-10-CM | POA: Insufficient documentation

## 2015-01-28 DIAGNOSIS — S79911A Unspecified injury of right hip, initial encounter: Secondary | ICD-10-CM | POA: Insufficient documentation

## 2015-01-28 DIAGNOSIS — F419 Anxiety disorder, unspecified: Secondary | ICD-10-CM | POA: Diagnosis not present

## 2015-01-28 DIAGNOSIS — M1611 Unilateral primary osteoarthritis, right hip: Secondary | ICD-10-CM

## 2015-01-28 DIAGNOSIS — Z85038 Personal history of other malignant neoplasm of large intestine: Secondary | ICD-10-CM | POA: Insufficient documentation

## 2015-01-28 DIAGNOSIS — Z79899 Other long term (current) drug therapy: Secondary | ICD-10-CM | POA: Diagnosis not present

## 2015-01-28 DIAGNOSIS — Z792 Long term (current) use of antibiotics: Secondary | ICD-10-CM | POA: Diagnosis not present

## 2015-01-28 DIAGNOSIS — Y998 Other external cause status: Secondary | ICD-10-CM | POA: Diagnosis not present

## 2015-01-28 NOTE — Discharge Instructions (Signed)

## 2015-01-28 NOTE — ED Notes (Signed)
Pt able to ambulate a approx 10 steps with 2 staff assist. Pt reports pain with bearing weight on right leg.

## 2015-01-28 NOTE — ED Notes (Signed)
Upon further assessment pt denies LOC. Pt reports uses walker but "carelessly did not use walker going to restroom this morning." Small skin tear noted to right ankle on assessment; Band-Aid applied. Pedal pulses moderate bilaterally.

## 2015-01-28 NOTE — ED Provider Notes (Signed)
CSN: [id number]     Arrival date & time 01/28/15  1324 History   First MD Initiated Contact with Patient 01/28/15 [phone]     Chief Complaint  Patient presents with  . Fall     (Consider location/radiation/quality/duration/timing/severity/associated sxs/prior Treatment) Patient is a 81 y.o. female presenting with fall. The history is provided by the patient.  Fall This is a new problem. Pertinent negatives include no abdominal pain.   patient with fall. Was walking without her walker and fell with pain in her right hip. Denies other injuries. States she did not hit her head. She is a DNR/DNI and is at an assisted living now. Had recent admission to the hospital and hospice appears to be consulted. She had recent possible pulmonary embolisms and was not started on anticoagulation.  Past Medical History  Diagnosis Date  . Arthritis   . Anxiety   . Fibromyalgia   . Colon cancer   . Ovarian cancer    Past Surgical History  Procedure Laterality Date  . Colon surgery    . Abdominal hysterectomy     No family history on file. History  Substance Use Topics  . Smoking status: Never Smoker   . Smokeless tobacco: Never Used  . Alcohol Use: Yes   OB History    No data available     Review of Systems  Constitutional: Negative for fever.  Respiratory: Positive for cough.   Gastrointestinal: Negative for abdominal pain.  Musculoskeletal:       Left hip pain  Skin: Positive for wound.  Neurological: Negative for light-headedness.      Allergies  Review of patient's allergies indicates no known allergies.  Home Medications   Prior to Admission medications   Medication Sig Start Date End Date Taking? Authorizing Provider  ALPRAZolam Duanne Moron) 0.25 MG tablet Take 2 tablets (0.5 mg total) by mouth every morning. For nerves 01/26/15  Yes Simbiso Ranga, MD  Calcium Carbonate-Vitamin D 600-200 MG-UNIT TABS Take 1 tablet by mouth daily before lunch.   Yes Historical Provider, MD   celecoxib (CELEBREX) 100 MG capsule Take one pill twice a day for pain Patient taking differently: Take 100 mg by mouth 2 (two) times daily.  07/04/12  Yes Maudry Diego, MD  cholecalciferol (VITAMIN D) 1000 UNITS tablet Take 1,000 Units by mouth daily.   Yes Historical Provider, MD  guaiFENesin (MUCINEX) 600 MG 12 hr tablet Take 2 tablets (1,200 mg total) by mouth 2 (two) times daily. 01/26/15  Yes Simbiso Ranga, MD  HYDROcodone-acetaminophen (NORCO/VICODIN) 5-325 MG per tablet Take 1 tablet by mouth every 6 (six) hours as needed (For pain if not relieved by Celebrex or Tylenol.).   Yes Historical Provider, MD  levofloxacin (LEVAQUIN) 500 MG tablet Take 1 tablet (500 mg total) by mouth daily. 01/26/15  Yes Simbiso Ranga, MD  mirtazapine (REMERON) 7.5 MG tablet Take 7.5 mg by mouth at bedtime.   Yes Historical Provider, MD  ondansetron (ZOFRAN) 8 MG tablet Take 8 mg by mouth every 8 (eight) hours as needed for nausea or vomiting.   Yes Historical Provider, MD  ranitidine (ZANTAC) 150 MG capsule Take 150 mg by mouth 2 (two) times daily.   Yes Historical Provider, MD  senna-docusate (SENOKOT-S) 8.6-50 MG per tablet Take 1 tablet by mouth daily. 01/26/15  Yes Simbiso Ranga, MD  sertraline (ZOLOFT) 50 MG tablet Take 50 mg by mouth at bedtime.   Yes Historical Provider, MD  tobramycin (TOBREX) 0.3 % ophthalmic solution Place 2  drops into both eyes 4 (four) times daily.   Yes Historical Provider, MD  Vitamin D, Ergocalciferol, (DRISDOL) 50000 UNITS CAPS capsule Take 50,000 Units by mouth every 7 (seven) days.   Yes Historical Provider, MD  HYDROcodone-acetaminophen (NORCO/VICODIN) 5-325 MG per tablet Take one every 6 hours for pain not helped by celebrex or tylenol 01/26/15   Simbiso Ranga, MD   BP 176/67 mmHg  Pulse 76  Temp(Src) 97.9 F (36.6 C) (Oral)  Resp 18  SpO2 98% Physical Exam  Constitutional: She appears well-developed.  HENT:  Head: Atraumatic.  Cardiovascular: Normal rate.    Pulmonary/Chest:  Diffuse mildly harsh breath sounds.  Abdominal: There is no tenderness.  Musculoskeletal: She exhibits tenderness.  Shortening of right lower extremity. Somewhat decreased range of motion. No lumbar spine tenderness. No cervical or thoracic spine tenderness. No tenderness down either lower extremity.  Neurological: She is alert.  Skin: Skin is warm.    ED Course  Procedures (including critical care time) Labs Review Labs Reviewed - No data to display  Imaging Review Ct Hip Right Wo Contrast  01/28/2015   CLINICAL DATA:  RIGHT hip pain. Unwitnessed fall. Hip fracture. Initial encounter.  EXAM: CT OF THE RIGHT HIP WITHOUT CONTRAST  TECHNIQUE: Multidetector CT imaging of the right hip was performed according to the standard protocol. Multiplanar CT image reconstructions were also generated.  COMPARISON:  01/23/2015.  FINDINGS: Severe RIGHT hip osteoarthritis is present with chronic remodeling of the femoral head and acetabulum. Lateral migration of the femoral head associated with osteoarthritis. Cortical buttressing along the medial femoral neck. There is no femur fracture. There is thinning of the acetabular wall but no acetabular protrusio. Visible sacrum and RIGHT SI joint appear within normal limits. The RIGHT obturator ring is intact. Atrophy of the RIGHT hip girdle musculature is moderate, likely secondary to disuse. Colonic diverticulosis.  IMPRESSION: No acute osseous abnormality. Severe RIGHT hip osteoarthritis without fracture.   Electronically Signed   By: Dereck Ligas M.D.   On: 01/28/2015 13:59   Dg Hip Unilat With Pelvis 2-3 Views Right  01/28/2015   CLINICAL DATA:  [place] [address] going into restroom this morning, did not use her walker, pain with palpation, foreshortening ; past history of colon cancer and ovarian cancer  EXAM: DG HIP W/ PELVIS 2-3V*R*  COMPARISON:  Pelvic radiograph 01/23/2015  FINDINGS: Marked osseous demineralization.  Advanced osteoarthritic changes  of RIGHT hip joint with joint space narrowing, spur formation, significant deformity/ flattening of RIGHT humeral head, and enlargement of the RIGHT acetabulum superiorly.  This may account for a leg length discrepancy.  No acute fracture, dislocation, or bone destruction.  Minimal narrowing of the LEFT hip joint.  SI joints poorly visualized.  Degenerative changes at visualized lower lumbar spine.  IMPRESSION: Advanced osteoarthritic changes of the RIGHT hip joint with flattening and destruction of the femoral head and erosion into the acetabular roof.  No definite acute fracture or dislocation identified.   Electronically Signed   By: Lavonia Dana M.D.   On: 01/28/2015 10:37     EKG Interpretation None      MDM   Final diagnoses:  Fall  Primary osteoarthritis of right hip    Patient with fall. Some right hip pain and shortening. X-ray appears to be chronic changes. CT scan done due to multiple changes on x-ray. CT scan does not show fracture. Patient is ambulating with assistance will be discharged back to the nursing home.    Jasper Riling. Alvino Chapel, MD 01/28/15  1453 

## 2015-01-28 NOTE — ED Notes (Signed)
Per EMS pt unwitnessed fall. Pt denies pain at present time but reports little pain with palpation to right hip.Shortening noted to right leg but no rotation. Staff unable to verify if shortening normal for pt.

## 2015-01-28 NOTE — ED Notes (Signed)
Pt transported to DG at present time.

## 2015-01-28 NOTE — ED Notes (Signed)
Bed: WA06 Expected date:  Expected time:  Means of arrival:  Comments: EMS - FALL 

## 2015-11-20 ENCOUNTER — Emergency Department (HOSPITAL_COMMUNITY)
Admission: EM | Admit: 2015-11-20 | Discharge: 2015-11-20 | Disposition: A | Discharge status: Home / Self Care | Payer: Medicare Other | Attending: Emergency Medicine | Admitting: Emergency Medicine

## 2015-11-20 ENCOUNTER — Emergency Department (HOSPITAL_COMMUNITY): Payer: Medicare Other

## 2015-11-20 ENCOUNTER — Encounter (HOSPITAL_COMMUNITY): Payer: Self-pay | Admitting: Emergency Medicine

## 2015-11-20 DIAGNOSIS — Z85038 Personal history of other malignant neoplasm of large intestine: Secondary | ICD-10-CM | POA: Diagnosis not present

## 2015-11-20 DIAGNOSIS — Z79899 Other long term (current) drug therapy: Secondary | ICD-10-CM | POA: Diagnosis not present

## 2015-11-20 DIAGNOSIS — K219 Gastro-esophageal reflux disease without esophagitis: Secondary | ICD-10-CM | POA: Insufficient documentation

## 2015-11-20 DIAGNOSIS — M797 Fibromyalgia: Secondary | ICD-10-CM | POA: Insufficient documentation

## 2015-11-20 DIAGNOSIS — F419 Anxiety disorder, unspecified: Secondary | ICD-10-CM | POA: Insufficient documentation

## 2015-11-20 DIAGNOSIS — Z8543 Personal history of malignant neoplasm of ovary: Secondary | ICD-10-CM | POA: Insufficient documentation

## 2015-11-20 DIAGNOSIS — M1611 Unilateral primary osteoarthritis, right hip: Secondary | ICD-10-CM | POA: Diagnosis not present

## 2015-11-20 DIAGNOSIS — X58XXXA Exposure to other specified factors, initial encounter: Secondary | ICD-10-CM | POA: Diagnosis not present

## 2015-11-20 DIAGNOSIS — I10 Essential (primary) hypertension: Secondary | ICD-10-CM | POA: Diagnosis not present

## 2015-11-20 DIAGNOSIS — Y998 Other external cause status: Secondary | ICD-10-CM | POA: Insufficient documentation

## 2015-11-20 DIAGNOSIS — S99921A Unspecified injury of right foot, initial encounter: Secondary | ICD-10-CM | POA: Diagnosis present

## 2015-11-20 DIAGNOSIS — Y9389 Activity, other specified: Secondary | ICD-10-CM | POA: Insufficient documentation

## 2015-11-20 DIAGNOSIS — S92501B Displaced unspecified fracture of right lesser toe(s), initial encounter for open fracture: Secondary | ICD-10-CM | POA: Insufficient documentation

## 2015-11-20 DIAGNOSIS — Y9289 Other specified places as the place of occurrence of the external cause: Secondary | ICD-10-CM | POA: Diagnosis not present

## 2015-11-20 DIAGNOSIS — F0390 Unspecified dementia without behavioral disturbance: Secondary | ICD-10-CM | POA: Diagnosis not present

## 2015-11-20 HISTORY — DX: Unspecified dementia, unspecified severity, without behavioral disturbance, psychotic disturbance, mood disturbance, and anxiety: F03.90

## 2015-11-20 HISTORY — DX: Essential (primary) hypertension: I10

## 2015-11-20 HISTORY — DX: Gastro-esophageal reflux disease without esophagitis: K21.9

## 2015-11-20 LAB — URINE MICROSCOPIC-ADD ON: RBC / HPF: NONE SEEN RBC/hpf (ref 0–5)

## 2015-11-20 LAB — URINALYSIS, ROUTINE W REFLEX MICROSCOPIC
BILIRUBIN URINE: NEGATIVE
Glucose, UA: NEGATIVE mg/dL
Hgb urine dipstick: NEGATIVE
KETONES UR: NEGATIVE mg/dL
Nitrite: NEGATIVE
Protein, ur: NEGATIVE mg/dL
SPECIFIC GRAVITY, URINE: 1.021 (ref 1.005–1.030)
pH: 6 (ref 5.0–8.0)

## 2015-11-20 LAB — I-STAT CHEM 8, ED
BUN: 25 mg/dL — ABNORMAL HIGH (ref 6–20)
Calcium, Ion: 1.32 mmol/L — ABNORMAL HIGH (ref 1.13–1.30)
Chloride: 103 mmol/L (ref 101–111)
Creatinine, Ser: 1.1 mg/dL — ABNORMAL HIGH (ref 0.44–1.00)
Glucose, Bld: 89 mg/dL (ref 65–99)
HEMATOCRIT: 36 % (ref 36.0–46.0)
HEMOGLOBIN: 12.2 g/dL (ref 12.0–15.0)
Potassium: 3.4 mmol/L — ABNORMAL LOW (ref 3.5–5.1)
SODIUM: 143 mmol/L (ref 135–145)
TCO2: 25 mmol/L (ref 0–100)

## 2015-11-20 LAB — CBC WITH DIFFERENTIAL/PLATELET
BASOS PCT: 0 %
Basophils Absolute: 0 10*3/uL (ref 0.0–0.1)
EOS ABS: 0.1 10*3/uL (ref 0.0–0.7)
Eosinophils Relative: 2 %
HCT: 36.1 % (ref 36.0–46.0)
HEMOGLOBIN: 11.1 g/dL — AB (ref 12.0–15.0)
Lymphocytes Relative: 37 %
Lymphs Abs: 2 10*3/uL (ref 0.7–4.0)
MCH: 28.2 pg (ref 26.0–34.0)
MCHC: 30.7 g/dL (ref 30.0–36.0)
MCV: 91.6 fL (ref 78.0–100.0)
Monocytes Absolute: 0.4 10*3/uL (ref 0.1–1.0)
Monocytes Relative: 7 %
Neutro Abs: 3 10*3/uL (ref 1.7–7.7)
Neutrophils Relative %: 54 %
Platelets: 214 10*3/uL (ref 150–400)
RBC: 3.94 MIL/uL (ref 3.87–5.11)
RDW: 14.1 % (ref 11.5–15.5)
WBC: 5.4 10*3/uL (ref 4.0–10.5)

## 2015-11-20 MED ORDER — CEPHALEXIN 500 MG PO CAPS: 500.0000 mg | ORAL_CAPSULE | Freq: Four times a day (QID) | ORAL | Status: DC

## 2015-11-20 MED ORDER — OXYCODONE-ACETAMINOPHEN 5-325 MG PO TABS
1.0000 | ORAL_TABLET | Freq: Three times a day (TID) | ORAL | Status: DC
Start: 2015-11-20 — End: 2019-05-21

## 2015-11-20 MED ORDER — TRAMADOL HCL 50 MG PO TABS
50.0000 mg | ORAL_TABLET | Freq: Once | ORAL | Status: AC
  Administered 2015-11-20: 50 mg via ORAL
  Filled 2015-11-20: qty 1

## 2015-11-20 NOTE — Discharge Instructions (Signed)
You have a broken 2nd toe on your right foot as well as infection in your left great toe.  Please take antibiotic and pain medication as prescribed.  Follow up with Dr. Lorin Mercy if there are no improvement of symptoms.  Take your pain medication as needed for pain.  Toe Fracture A toe fracture is a break in one of the toe bones (phalanges). CAUSES This condition may be caused by:  Dropping a heavy object on your toe.  Stubbing your toe.  Overusing your toe or doing repetitive exercise.  Twisting or stretching your toe out of place. RISK FACTORS This condition is more likely to develop in people who:  Play contact sports.  Have a bone disease.  Have a low calcium level. SYMPTOMS The main symptoms of this condition are swelling and pain in the toe. The pain may get worse with standing or walking. Other symptoms include:  Bruising.  Stiffness.  Numbness.  A change in the way the toe looks.  Broken bones that poke through the skin.  Blood beneath the toenail. DIAGNOSIS This condition is diagnosed with a physical exam. You may also have X-rays. TREATMENT  Treatment for this condition depends on the type of fracture and its severity. Treatment may involve:  Taping the broken toe to a toe that is next to it (buddy taping). This is the most common treatment for fractures in which the bone has not moved out of place (nondisplaced fracture).  Wearing a shoe that has a wide, rigid sole to protect the toe and to limit its movement.  Wearing a walking cast.  Having a procedure to move the toe back into place.  Surgery. This may be needed:  If there are many pieces of broken bone that are out of place (displaced).  If the toe joint breaks.  If the bone breaks through the skin.  Physical therapy. This is done to help regain movement and strength in the toe. You may need follow-up X-rays to make sure that the bone is healing well and staying in position. HOME CARE  INSTRUCTIONS If You Have a Cast:  Do not stick anything inside the cast to scratch your skin. Doing that increases your risk of infection.  Check the skin around the cast every day. Report any concerns to your health care provider. You may put lotion on dry skin around the edges of the cast. Do not apply lotion to the skin underneath the cast.  Do not put pressure on any part of the cast until it is fully hardened. This may take several hours.  Keep the cast clean and dry. Bathing  Do not take baths, swim, or use a hot tub until your health care provider approves. Ask your health care provider if you can take showers. You may only be allowed to take sponge baths for bathing.  If your health care provider approves bathing and showering, cover the cast or bandage (dressing) with a watertight plastic bag to protect it from water. Do not let the cast or dressing get wet. Managing Pain, Stiffness, and Swelling  If you do not have a cast, apply ice to the injured area, if directed.  Put ice in a plastic bag.  Place a towel between your skin and the bag.  Leave the ice on for 20 minutes, 2-3 times per day.  Move your toes often to avoid stiffness and to lessen swelling.  Raise (elevate) the injured area above the level of your heart while you are  sitting or lying down. Driving  Do not drive or operate heavy machinery while taking pain medicine.  Do not drive while wearing a cast on a foot that you use for driving. Activity  Return to your normal activities as directed by your health care provider. Ask your health care provider what activities are safe for you.  Perform exercises daily as directed by your health care provider or physical therapist. Safety  Do not use the injured limb to support your body weight until your health care provider says that you can. Use crutches or other assistive devices as directed by your health care provider. General Instructions  If your toe was  treated with buddy taping, follow your health care provider's instructions for changing the gauze and tape. Change it more often:  The gauze and tape get wet. If this happens, dry the space between the toes.  The gauze and tape are too tight and cause your toe to become pale or numb.  Wear a protective shoe as directed by your health care provider. If you were not given a protective shoe, wear sturdy, supportive shoes. Your shoes should not pinch your toes and should not fit tightly against your toes.  Do not use any tobacco products, including cigarettes, chewing tobacco, or e-cigarettes. Tobacco can delay bone healing. If you need help quitting, ask your health care provider.  Take medicines only as directed by your health care provider.  Keep all follow-up visits as directed by your health care provider. This is important. SEEK MEDICAL CARE IF:  You have a fever.  Your pain medicine is not helping.  Your toe is cold.  Your toe is numb.  You still have pain after one week of rest and treatment.  You still have pain after your health care provider has said that you can start walking again.  You have pain, tingling, or numbness in your foot that is not going away. SEEK IMMEDIATE MEDICAL CARE IF:  You have severe pain.  You have redness or inflammation in your toe that is getting worse.  You have pain or numbness in your toe that is getting worse.  Your toe turns blue.   This information is not intended to replace advice given to you by your health care provider. Make sure you discuss any questions you have with your health care provider.   Document Released: 12/14/2000 Document Revised: 09/07/2015 Document Reviewed: 10/13/2014 Elsevier Interactive Patient Education 2016 Elsevier Inc.  Osteoarthritis Osteoarthritis is a disease that causes soreness and inflammation of a joint. It occurs when the cartilage at the affected joint wears down. Cartilage acts as a cushion,  covering the ends of bones where they meet to form a joint. Osteoarthritis is the most common form of arthritis. It often occurs in older people. The joints affected most often by this condition include those in the:  Ends of the fingers.  Thumbs.  Neck.  Lower back.  Knees.  Hips. CAUSES  Over time, the cartilage that covers the ends of bones begins to wear away. This causes bone to rub on bone, producing pain and stiffness in the affected joints.  RISK FACTORS Certain factors can increase your chances of having osteoarthritis, including:  Older age.  Excessive body weight.  Overuse of joints.  Previous joint injury. SIGNS AND SYMPTOMS   Pain, swelling, and stiffness in the joint.  Over time, the joint may lose its normal shape.  Small deposits of bone (osteophytes) may grow on the edges of  the joint.  Bits of bone or cartilage can break off and float inside the joint space. This may cause more pain and damage. DIAGNOSIS  Your health care provider will do a physical exam and ask about your symptoms. Various tests may be ordered, such as:  X-rays of the affected joint.  Blood tests to rule out other types of arthritis. Additional tests may be used to diagnose your condition. TREATMENT  Goals of treatment are to control pain and improve joint function. Treatment plans may include:  A prescribed exercise program that allows for rest and joint relief.  A weight control plan.  Pain relief techniques, such as:  Properly applied heat and cold.  Electric pulses delivered to nerve endings under the skin (transcutaneous electrical nerve stimulation [TENS]).  Massage.  Certain nutritional supplements.  Medicines to control pain, such as:  Acetaminophen.  Nonsteroidal anti-inflammatory drugs (NSAIDs), such as naproxen.  Narcotic or central-acting agents, such as tramadol.  Corticosteroids. These can be given orally or as an injection.  Surgery to reposition  the bones and relieve pain (osteotomy) or to remove loose pieces of bone and cartilage. Joint replacement may be needed in advanced states of osteoarthritis. HOME CARE INSTRUCTIONS   Take medicines only as directed by your health care provider.  Maintain a healthy weight. Follow your health care provider's instructions for weight control. This may include dietary instructions.  Exercise as directed. Your health care provider can recommend specific types of exercise. These may include:  Strengthening exercises. These are done to strengthen the muscles that support joints affected by arthritis. They can be performed with weights or with exercise bands to add resistance.  Aerobic activities. These are exercises, such as brisk walking or low-impact aerobics, that get your heart pumping.  Range-of-motion activities. These keep your joints limber.  Balance and agility exercises. These help you maintain daily living skills.  Rest your affected joints as directed by your health care provider.  Keep all follow-up visits as directed by your health care provider. SEEK MEDICAL CARE IF:   Your skin turns red.  You develop a rash in addition to your joint pain.  You have worsening joint pain.  You have a fever along with joint or muscle aches. SEEK IMMEDIATE MEDICAL CARE IF:  You have a significant loss of weight or appetite.  You have night sweats. [place] of Arthritis and Musculoskeletal and Skin Diseases: www.niams.SouthExposed.es  [employer] on Aging: http://kim-miller.com/  American College of Rheumatology: www.rheumatology.org   This information is not intended to replace advice given to you by your health care provider. Make sure you discuss any questions you have with your health care provider.   Document Released: 12/17/2005 Document Revised: 01/07/2015 Document Reviewed: 08/24/2013 Elsevier Interactive Patient Education [employer].

## 2015-11-20 NOTE — ED Notes (Signed)
Awake. Verbally responsive. A/O x4. Resp even and unlabored. No audible adventitious breath sounds noted. ABC's intact.  

## 2015-11-20 NOTE — ED Notes (Signed)
Pt from [place] [facility] [address]. Pt noticed the top of her R toe was bleeding today. Pt unsure of why it is bleeding. Denies any falls, injuries or being on blood thinners. Also having R hip pain that is worse than usual today.

## 2015-11-20 NOTE — ED Provider Notes (Signed)
CSN: [id number]     Arrival date & time 11/20/15  1429 History   First MD Initiated Contact with Patient 11/20/15 1454     Chief Complaint  Patient presents with  . Toe Injury     (Consider location/radiation/quality/duration/timing/severity/associated sxs/prior Treatment) HPI   81 year old female with history of dementia, fibromyalgia, anxiety, arthritis brought here via EMS from [facility] for evaluation of bleeding toe. Patient noticed that the top of her right toe was bleeding today. She is unsure why. She denies any specific injury or being on blood thinner medication. Her primary complaint is pain to her right hip. States that she has pain to the hip before but much worse today. Pain is worsened with movement. Patient is wheel chair bound.  She is on home O2. She is a poor historian due to dementia. She however denies having headache, neck pain, chest pain, trouble breathing, abdominal pain, dysuria. She is requesting for pain medication. Level V caveats applies.  Past Medical History  Diagnosis Date  . Arthritis   . Anxiety   . Fibromyalgia   . Colon cancer ([place])   . Ovarian cancer ([place])   . Dementia   . GERD (gastroesophageal reflux disease)   . Hypertension    Past Surgical History  Procedure Laterality Date  . Colon surgery    . Abdominal hysterectomy     History reviewed. No pertinent family history. Social History  Substance Use Topics  . Smoking status: Never Smoker   . Smokeless tobacco: Never Used  . Alcohol Use: Yes   OB History    No data available     Review of Systems  Unable to perform ROS: Dementia  Genitourinary: Negative for pelvic pain.  Musculoskeletal: Negative for back pain.  Skin: Positive for wound.  Neurological: Negative for headaches.      Allergies  Review of patient's allergies indicates no known allergies.  Home Medications   Prior to Admission medications   Medication Sig Start Date End Date Taking? Authorizing  Provider  acetaminophen (MAPAP) 500 MG tablet Take 500 mg by mouth every 4 (four) hours as needed for fever (and pain).   Yes Historical Provider, MD  ALPRAZolam Duanne Moron) 0.25 MG tablet Take 2 tablets (0.5 mg total) by mouth every morning. For nerves Patient taking differently: Take 0.0125 mg by mouth every morning. For nerves 01/26/15  Yes Simbiso Ranga, MD  mirtazapine (REMERON) 7.5 MG tablet Take 7.5 mg by mouth at bedtime.   Yes Historical Provider, MD  ondansetron (ZOFRAN) 8 MG tablet Take 8 mg by mouth 3 (three) times daily as needed for nausea or vomiting.    Yes Historical Provider, MD  oxyCODONE-acetaminophen (PERCOCET/ROXICET) 5-325 MG tablet Take 1 tablet by mouth 3 (three) times daily.   Yes Historical Provider, MD  OXYGEN Inhale 2 L into the lungs continuous.   Yes Historical Provider, MD  ranitidine (ZANTAC) 150 MG tablet Take 150 mg by mouth 2 (two) times daily.   Yes Historical Provider, MD  senna-docusate (SENOKOT-S) 8.6-50 MG per tablet Take 1 tablet by mouth daily. 01/26/15  Yes Simbiso Ranga, MD  sertraline (ZOLOFT) 25 MG tablet Take 75 mg by mouth at bedtime.   Yes Historical Provider, MD  Skin Protectants, Misc. (EUCERIN) cream Apply 1 application topically 2 (two) times daily. Applies to lower legs   Yes Historical Provider, MD  celecoxib (CELEBREX) 100 MG capsule Take one pill twice a day for pain Patient not taking: Reported on 11/20/2015 07/04/12  Milton Ferguson, MD  guaiFENesin (MUCINEX) 600 MG 12 hr tablet Take 2 tablets (1,200 mg total) by mouth 2 (two) times daily. Patient not taking: Reported on 11/20/2015 01/26/15   Nat Math, MD  HYDROcodone-acetaminophen (NORCO/VICODIN) 5-325 MG per tablet Take one every 6 hours for pain not helped by celebrex or tylenol Patient not taking: Reported on 11/20/2015 01/26/15   Simbiso Ranga, MD   BP 130/62 mmHg  Pulse 68  Temp(Src) 98 F (36.7 C)  Resp 16  SpO2 100% Physical Exam  Constitutional: She appears well-developed and  well-nourished. No distress.  Well appearing Caucasian female of her stated age.  HENT:  Head: Atraumatic.  No scalp tenderness  Eyes: Conjunctivae are normal.  Neck: Normal range of motion. Neck supple.  No cervical midline spine tenderness  Cardiovascular: Normal rate and regular rhythm.   Pulmonary/Chest: Effort normal and breath sounds normal. She exhibits no tenderness.  Abdominal: Soft. There is no tenderness.  Musculoskeletal: She exhibits tenderness (Right hip: Tenderness along lateral hip and proximal femoral region on palpation with normal hip flexion extension abduction and abduction. No gross deformity. Right foot: Tenderness to second toe with deformity noted and an open wound to the dorsum of toe).  No midline spine tenderness crepitus or step-off.  Neurological: She is alert.  Skin: No rash noted.  Left foot: Great toenail appears deformed, with a dressing in place that is saturated with serous discharge.    Psychiatric: She has a normal mood and affect.  Nursing note and vitals reviewed.   ED Course  Procedures (including critical care time)  Patient complaining of right hip pain and right foot pain. She has a deformity to the second toe on the right foot with an open wound suspected open fracture. X-ray ordered. She also has pain to her right hip but no back pain. R hip pain is recurrent.  No signs of injury. X-ray of right hip were ordered.  4:26 PM X-ray of right hip shows advanced osteoarthritis which is stable and no evidence of acute fracture noted. X-ray of right foot demonstrate a plantar dislocation of the right second toe at the level of the PIP joint. The cortical erosion at the distal aspect of the proximal fragments in the right second toe. She does not appears infected to suggest osteomyelitis. This is likely posttraumatic.  4:28 PM Wound care provided, post-op shoe for R foot.  I will obtain xray of L foot to ensure no evidence of bony infection such as  osteo.  Care discussed with Dr. Ralene Bathe.  5:56 PM X-ray of left foot demonstrate erosion of the dorsal distal aspects of the proximal phalanx great toe in which osteomyelitis is not excluded. I did attempt to reach out and contact patient's family member to discuss option of care but unable to get in touch with anyone. Patient has follow-up with [facility] in the past. No documented orthopedist available in chart. I will consult on-call orthopedist.  6:10 PM Appreciate consultation from on-call surgeon Dr. Lorin Mercy, who recommend Keflex antibiotic, pain medication and pt can f/u in office.  Pt's wound has been cleansed and dressed.    7:01 PM i had a chance to update pt's care with pt's Son in [place] at bedside.    Labs Review Labs Reviewed - No data to display  Imaging Review Dg Foot Complete Left  11/20/2015  CLINICAL DATA:  LEFT toe  bleeding.  Evaluate for osteomyelitis. EXAM: LEFT FOOT - COMPLETE 3+ VIEW FINDINGS: Advanced degenerative  change throughout the tarsometatarsal joints and phalanges. Skeletal osteopenia is noted. There is soft tissue swelling of the great toe, with erosion of the dorsal distal aspect of the proximal phalanx, as seen on the AP and oblique view; this area is more difficult to view on the lateral projection. Osteomyelitis not excluded. IMPRESSION: Erosion of the dorsal distal aspect of the proximal phalanx great toe. Osteomyelitis not excluded. Electronically Signed   By: Staci Righter M.D.   On: 11/20/2015 17:11   Dg Foot Complete Right  11/20/2015  CLINICAL DATA:  Bleeding from the distal right first toe. Uncertain fall. EXAM: RIGHT FOOT COMPLETE - 3+ VIEW COMPARISON:  None. FINDINGS: There is plantar dislocation of the middle phalanx of the right second toe at the proximal interphalangeal joint. There is cortical erosion at the distal aspect of the proximal phalanx in the right second toe. No fracture, additional dislocation, additional cortical erosions or  suspicious focal osseous lesion. Lisfranc joint appears intact. Mild hallux valgus deformity. Mild osteoarthritis at the first metatarsophalangeal joint. Diffuse osteopenia. IMPRESSION: 1. Plantar dislocation of the right second toe at the level of the PIP joint. 2. Nonspecific cortical erosion at the distal aspect of the proximal phalanx in the right second toe, which could be posttraumatic, although osteomyelitis cannot be excluded and clinical correlation is necessary. 3. Diffuse osteopenia. Electronically Signed   By: Ilona Sorrel M.D.   On: 11/20/2015 16:09   Dg Hip Unilat With Pelvis 2-3 Views Right  11/20/2015  CLINICAL DATA:  Worse than usual right hip pain. EXAM: DG HIP (WITH OR WITHOUT PELVIS) 2-3V RIGHT COMPARISON:  01/28/2015 FINDINGS: There is no evidence of hip fracture or dislocation. There are severe osteoarthritic changes of the right hip with remodeling of the femoral head and acetabulum, lytic and sclerotic appearance of the subchondral bony cortex, narrowing of the joint space. The findings are stable from the previous radiographs dated January 2016. Postsurgical changes is seen in the abdomen. There is a moderate amount of stool throughout the colon. IMPRESSION: Stable advanced osteoarthritic changes of the right hip. No evidence of fracture or subluxation. Electronically Signed   By: Fidela Salisbury M.D.   On: 11/20/2015 16:02   I have personally reviewed and evaluated these images and lab results as part of my medical decision-making.   EKG Interpretation None      MDM   Final diagnoses:  Fracture of second toe, right, open, initial encounter  Osteoarthritis of right hip, unspecified osteoarthritis type    BP 149/74 mmHg  Pulse 70  Temp(Src) 98 F (36.7 C)  Resp 16  SpO2 99%     Domenic Moras, PA-C 11/20/15 1901  Quintella Reichert, MD 11/21/15 [phone]

## 2015-11-20 NOTE — ED Notes (Signed)
Pt returned from x-ray without distress noted. 

## 2015-11-20 NOTE — ED Notes (Addendum)
Awake. Verbally responsive. A/O x4. Resp even and unlabored. No audible adventitious breath sounds noted. ABC's intact. Pt used bedpan and void. Cleanse rt foot with saline and apply nonadhesive dry dsg. Pt tolerated well.

## 2015-11-20 NOTE — ED Notes (Signed)
Bed: [id number] Expected date: 11/20/15 Expected time: 2:27 PM Means of arrival: Ambulance Comments: Bleeding from toe

## 2015-11-20 NOTE — ED Notes (Signed)
[facility] called to transport pt back to [facility] 

## 2015-11-20 NOTE — ED Notes (Signed)
Attempted to call report x 3 w/o success.  Per pt's paperwork pt is from [place].

## 2016-03-14 IMAGING — CR DG PELVIS 1-2V
2 series · 2 of 2 positions shown · non-contrast
Comparison: 04/30/2012

CLINICAL DATA: Altered mental status and emesis. Decreased oxygen
saturation. Fever. Fell 2-4 weeks ago.

EXAM:
PELVIS - 1-2 VIEW

[x pelvis (1 of 2)]
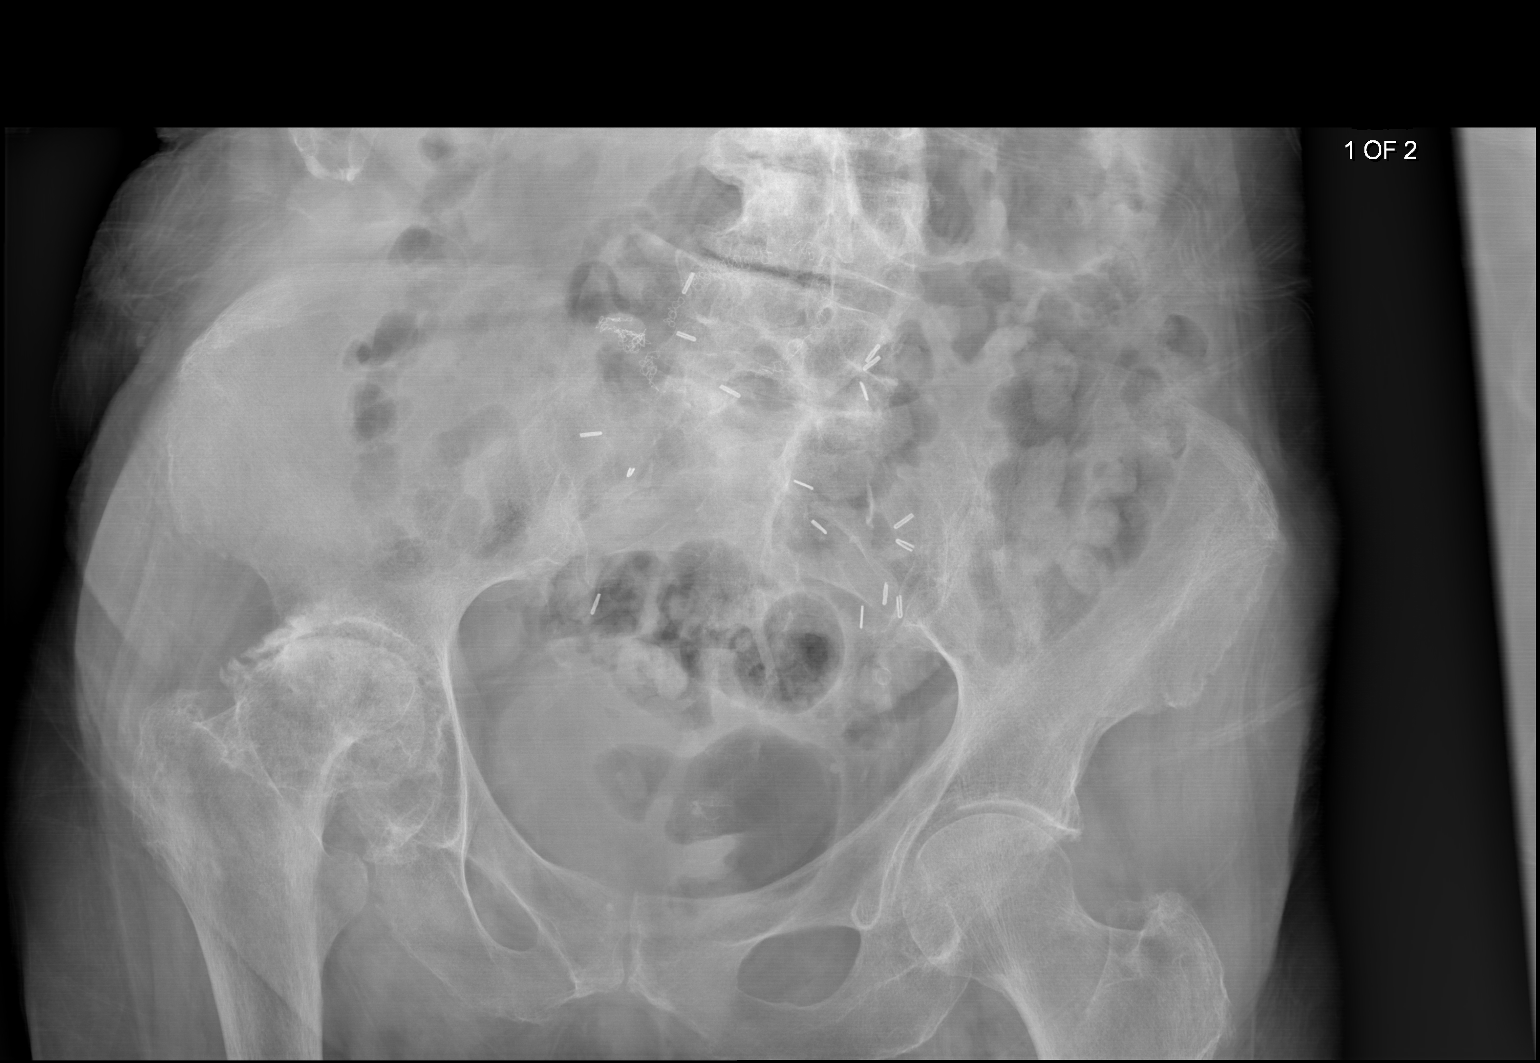

[x pelvis (2 of 2)]
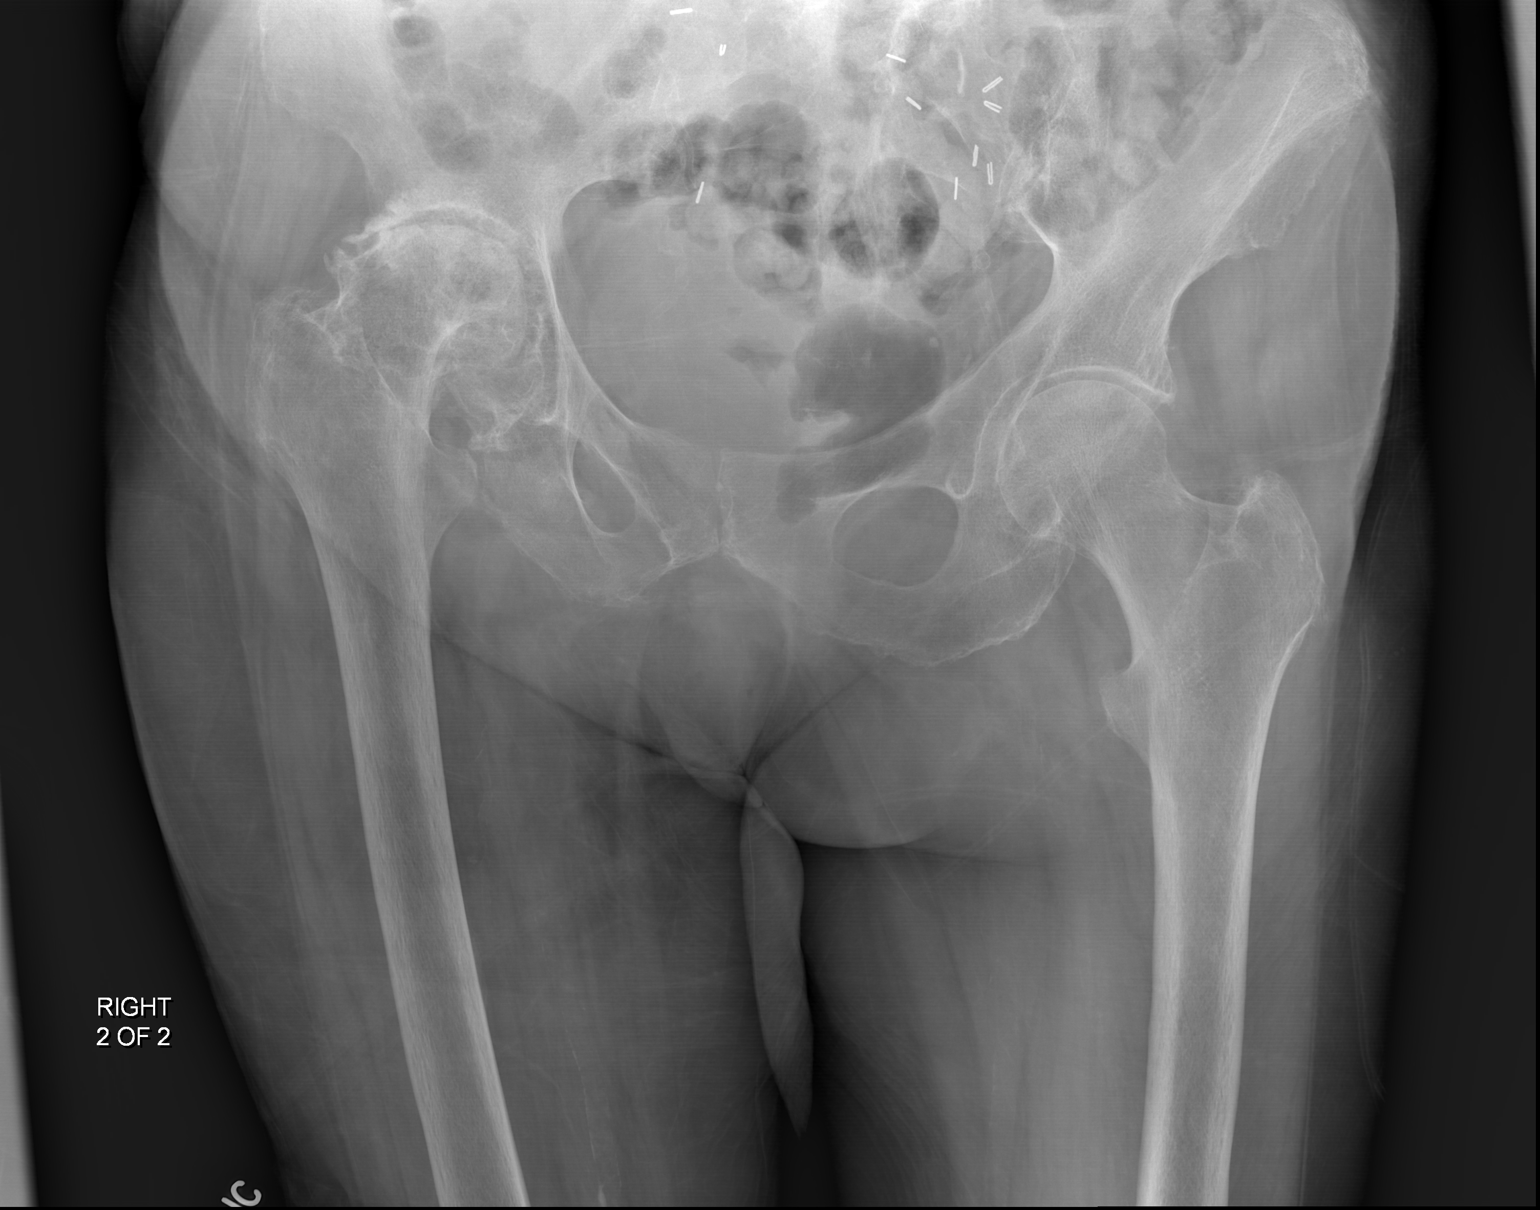

[2 of 2 positions shown; findings below may reference images not displayed]

FINDINGS: Right hip demonstrates prominent degenerative changes with severe
joint space loss, sclerosis and hypertrophic changes on both sides
of the joint, and bone lucency and collapse along the superior
femoral head with associated remodeling. This appears similar to
previous study without significant progression. No definite evidence
of any acute fracture and no dislocation. Minimal degenerative
changes in the left hip. Pelvis appears intact. SI joints and
symphysis pubis are not displaced. Degenerative changes and
scoliosis of the lumbar spine. Surgical clips in the upper pelvis.
IMPRESSION: Severe degenerative changes in the right hip appear similar to prior
study. No definite acute fracture.

## 2016-03-14 IMAGING — CT CT HEAD W/O CM
2 series · 16 of 30 positions shown, 19 images · non-contrast
Comparison: MRI brain 06/27/2011.  CT head 01/24/2005.

CLINICAL DATA: Altered mental status. Fever. History of colon
cancer and ovarian cancer. Patient fell 2 weeks ago.

EXAM:
CT HEAD WITHOUT CONTRAST
TECHNIQUE: Contiguous axial images were obtained from the base of the skull
through the vertex without intravenous contrast.

[Series 2: head w/o · axial · non-contrast · 0.45mm/px · z∈[+1312,+1437]mm · 9 of 33 slices shown, 12 images]
[im 4/33  brain]
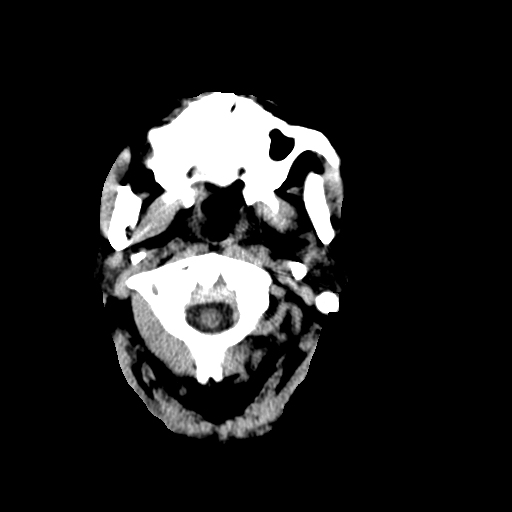
[im 4/33  bone]
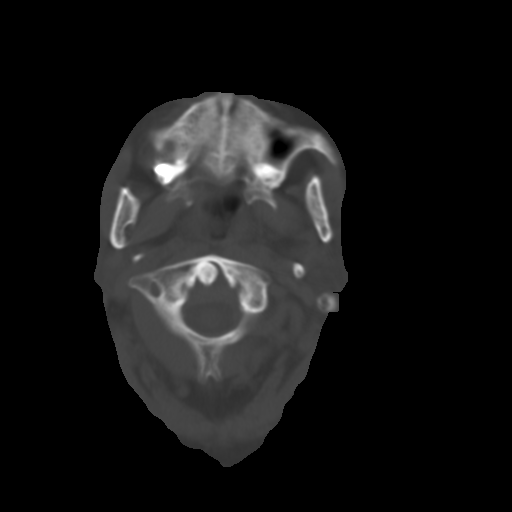
[im 7/33  brain]
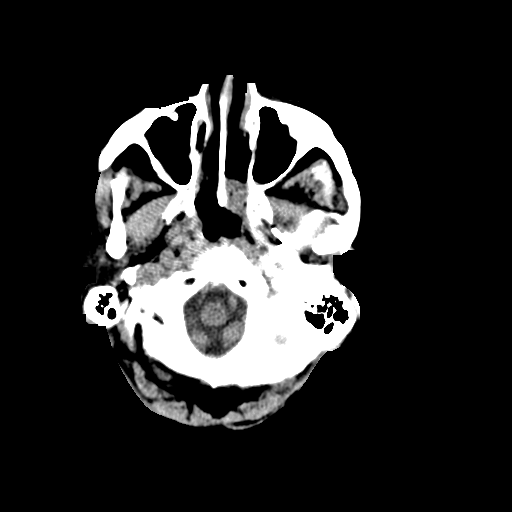
[im 10/33  brain]
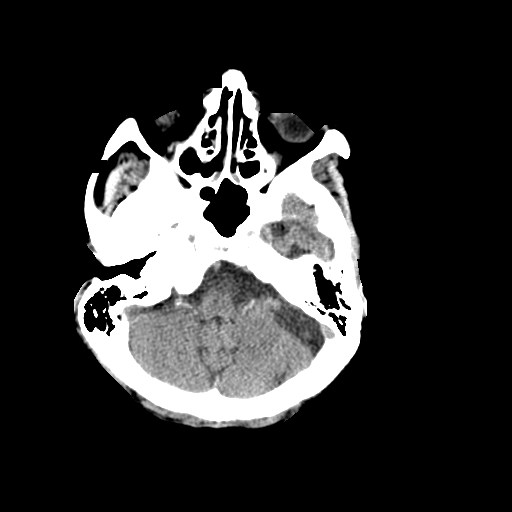
[im 13/33  brain]
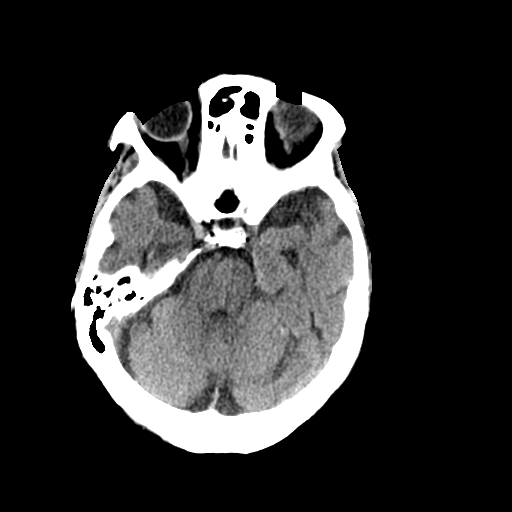
[im 17/33  brain]
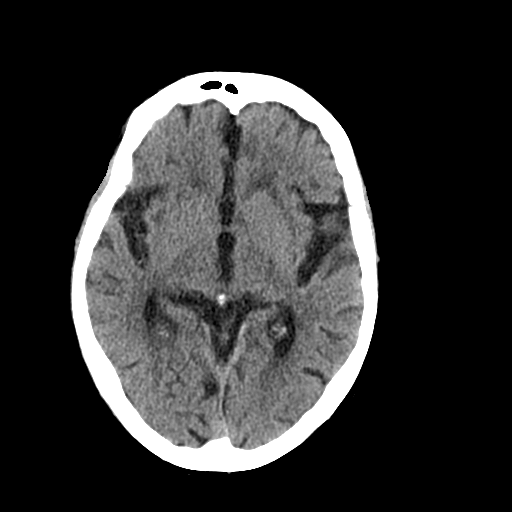
[im 17/33  bone]
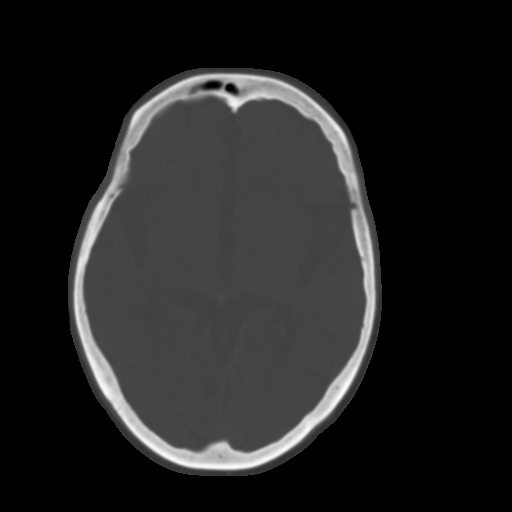
[im 20/33  brain]
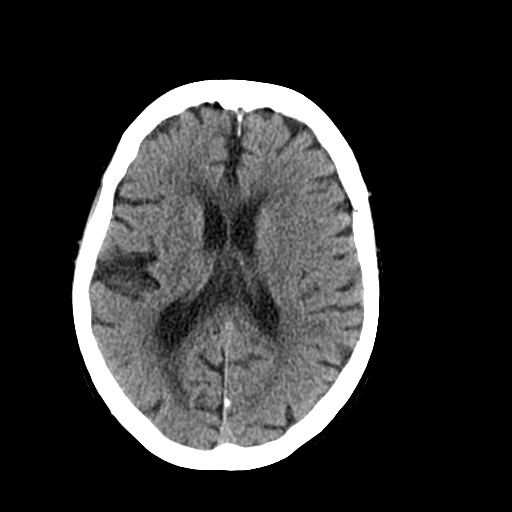
[im 23/33  brain]
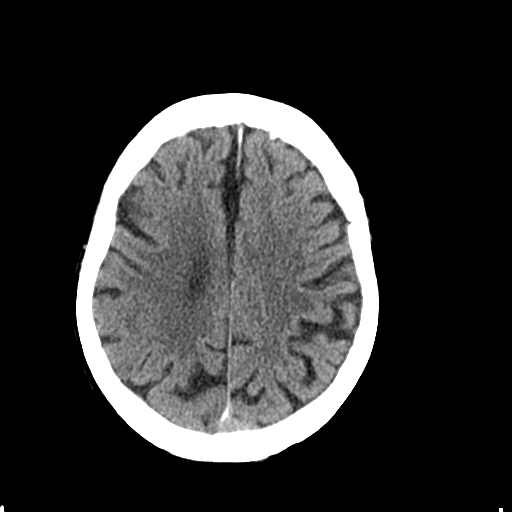
[im 26/33  brain]
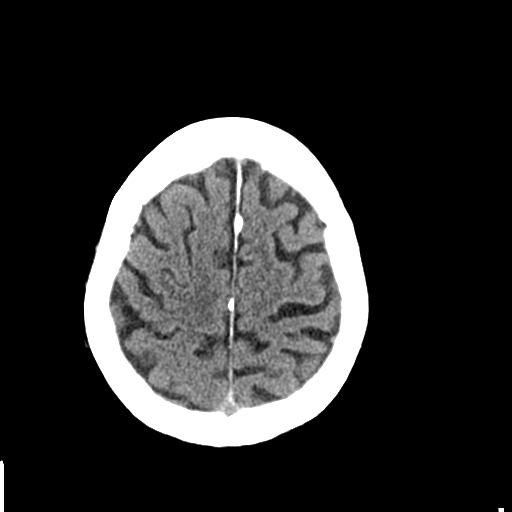
[im 29/33  brain]
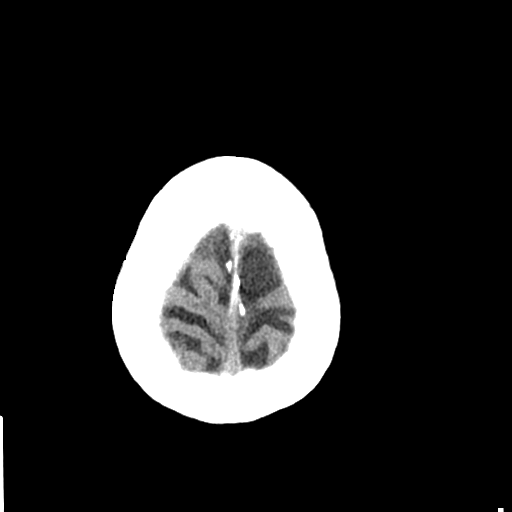
[im 29/33  bone]
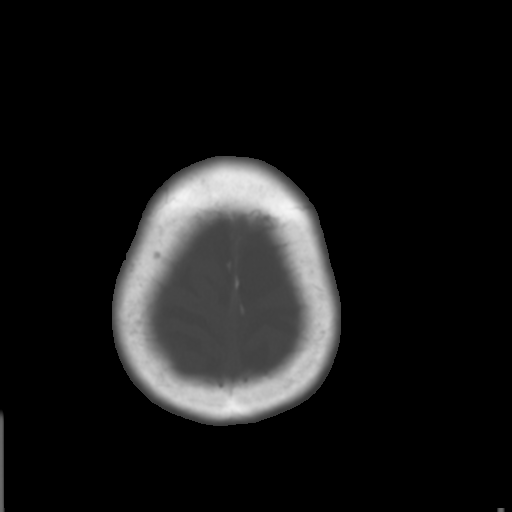

[Series 3: bone windows · axial · 0.45mm/px · z∈[+1315,+1423]mm · 7 of 55 slices shown]
[im 7/55  bone]
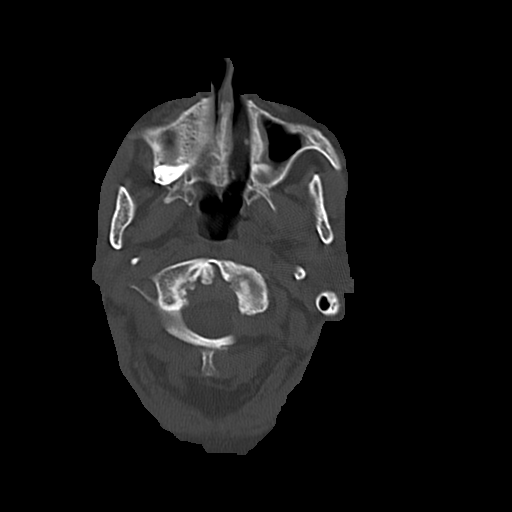
[im 13/55  bone]
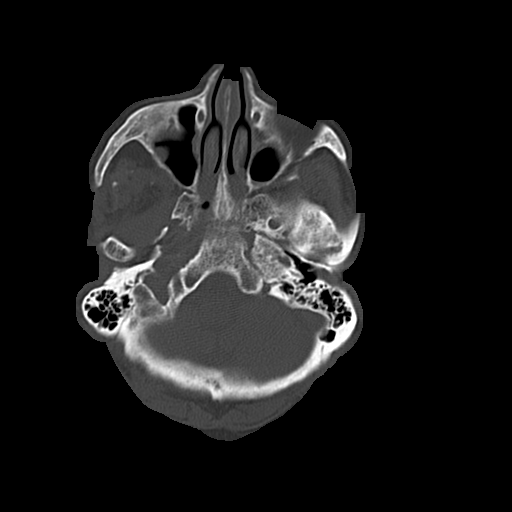
[im 19/55  bone]
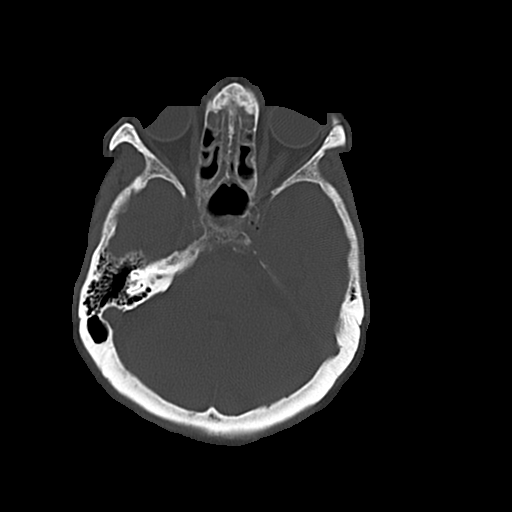
[im 25/55  bone]
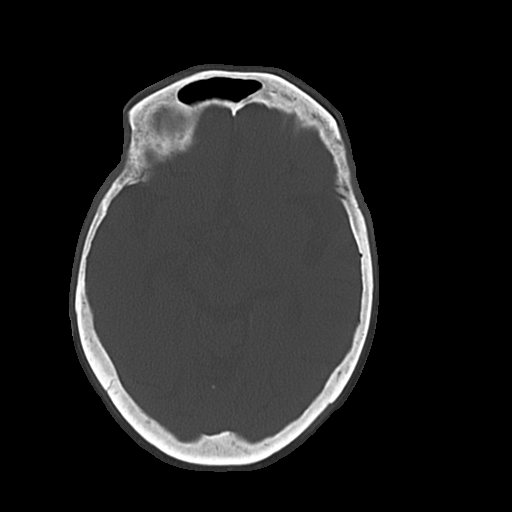
[im 31/55  bone]
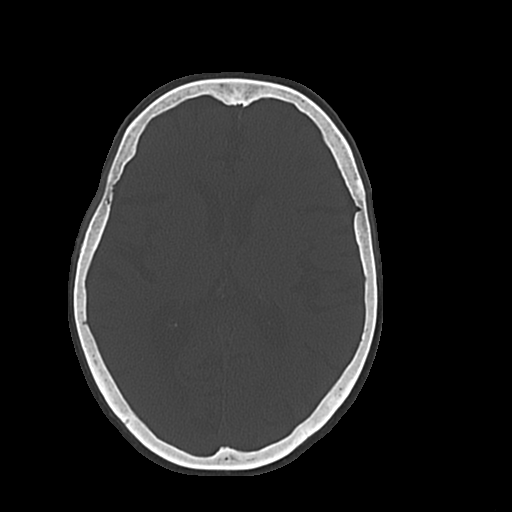
[im 37/55  bone]
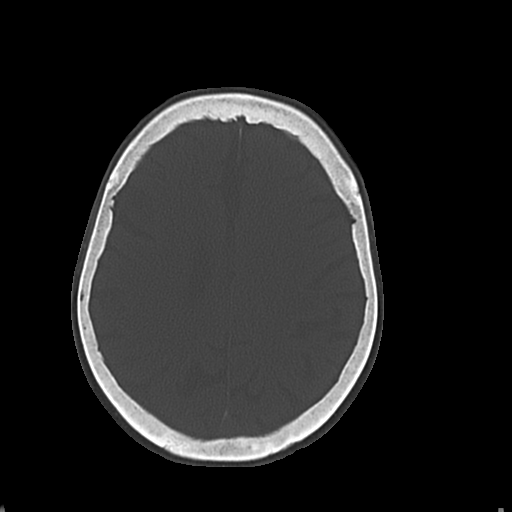
[im 43/55  bone]
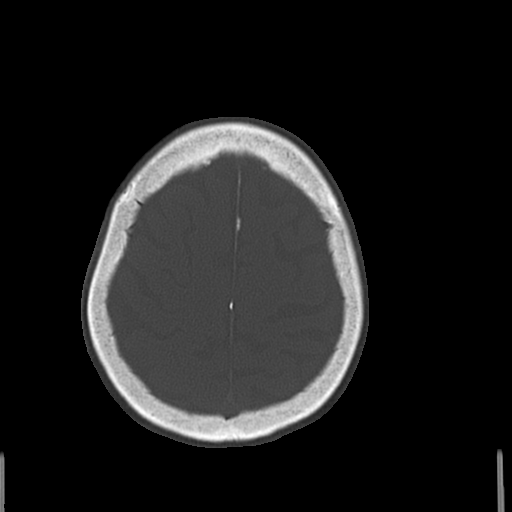

[16 of 30 positions shown; findings below may reference images not displayed]

FINDINGS: Diffuse cerebral atrophy. Mild ventricular dilatation consistent
with central atrophy. Low-attenuation changes throughout the deep
white matter consistent with small vessel ischemia. Atrophy and
small vessel changes have increased since the previous head CT. No
mass effect or midline shift. No abnormal extra-axial fluid
collections. Gray-white matter junctions are distinct. Basal
cisterns are not effaced. No evidence of acute intracranial
hemorrhage. No depressed skull fractures. Diffuse mucosal thickening
throughout the paranasal sinuses. Mastoid air cells are not
opacified. Vascular calcifications. Prominent degenerative changes
at C1 to level.
IMPRESSION: No acute intracranial abnormalities. Chronic atrophy and small
vessel ischemic changes.

## 2017-05-07 ENCOUNTER — Emergency Department (HOSPITAL_COMMUNITY): Payer: Medicare Other

## 2017-05-07 ENCOUNTER — Emergency Department (HOSPITAL_COMMUNITY)
Admission: EM | Admit: 2017-05-07 | Discharge: 2017-05-07 | Disposition: A | Discharge status: Home / Self Care | Payer: Medicare Other | Attending: Emergency Medicine | Admitting: Emergency Medicine

## 2017-05-07 ENCOUNTER — Encounter (HOSPITAL_COMMUNITY): Payer: Self-pay | Admitting: *Deleted

## 2017-05-07 DIAGNOSIS — W06XXXA Fall from bed, initial encounter: Secondary | ICD-10-CM | POA: Diagnosis not present

## 2017-05-07 DIAGNOSIS — Y92129 Unspecified place in nursing home as the place of occurrence of the external cause: Secondary | ICD-10-CM | POA: Insufficient documentation

## 2017-05-07 DIAGNOSIS — I129 Hypertensive chronic kidney disease with stage 1 through stage 4 chronic kidney disease, or unspecified chronic kidney disease: Secondary | ICD-10-CM | POA: Insufficient documentation

## 2017-05-07 DIAGNOSIS — S79911A Unspecified injury of right hip, initial encounter: Secondary | ICD-10-CM | POA: Diagnosis present

## 2017-05-07 DIAGNOSIS — Z8543 Personal history of malignant neoplasm of ovary: Secondary | ICD-10-CM | POA: Diagnosis not present

## 2017-05-07 DIAGNOSIS — F0390 Unspecified dementia without behavioral disturbance: Secondary | ICD-10-CM | POA: Insufficient documentation

## 2017-05-07 DIAGNOSIS — N183 Chronic kidney disease, stage 3 (moderate): Secondary | ICD-10-CM | POA: Insufficient documentation

## 2017-05-07 DIAGNOSIS — Y939 Activity, unspecified: Secondary | ICD-10-CM | POA: Insufficient documentation

## 2017-05-07 DIAGNOSIS — Y999 Unspecified external cause status: Secondary | ICD-10-CM | POA: Insufficient documentation

## 2017-05-07 DIAGNOSIS — M25551 Pain in right hip: Secondary | ICD-10-CM | POA: Diagnosis not present

## 2017-05-07 DIAGNOSIS — Z85038 Personal history of other malignant neoplasm of large intestine: Secondary | ICD-10-CM | POA: Diagnosis not present

## 2017-05-07 DIAGNOSIS — W19XXXA Unspecified fall, initial encounter: Secondary | ICD-10-CM

## 2017-05-07 LAB — BASIC METABOLIC PANEL
ANION GAP: 8 (ref 5–15)
BUN: 20 mg/dL (ref 6–20)
CALCIUM: 10.2 mg/dL (ref 8.9–10.3)
CO2: 27 mmol/L (ref 22–32)
CREATININE: 1.19 mg/dL — AB (ref 0.44–1.00)
Chloride: 106 mmol/L (ref 101–111)
GFR calc Af Amer: 43 mL/min — ABNORMAL LOW (ref >60)
GFR, EST NON AFRICAN AMERICAN: 37 mL/min — AB (ref >60)
Glucose, Bld: 107 mg/dL — ABNORMAL HIGH (ref 65–99)
Potassium: 3.6 mmol/L (ref 3.5–5.1)
Sodium: 141 mmol/L (ref 135–145)

## 2017-05-07 LAB — CBC WITH DIFFERENTIAL/PLATELET
BASOS ABS: 0 10*3/uL (ref 0.0–0.1)
Basophils Relative: 0 %
EOS ABS: 0.1 10*3/uL (ref 0.0–0.7)
Eosinophils Relative: 1 %
HEMATOCRIT: 40.3 % (ref 36.0–46.0)
Hemoglobin: 12.8 g/dL (ref 12.0–15.0)
Lymphocytes Relative: 26 %
Lymphs Abs: 1.5 10*3/uL (ref 0.7–4.0)
MCH: 28.8 pg (ref 26.0–34.0)
MCHC: 31.8 g/dL (ref 30.0–36.0)
MCV: 90.6 fL (ref 78.0–100.0)
MONO ABS: 0.3 10*3/uL (ref 0.1–1.0)
MONOS PCT: 6 %
NEUTROS PCT: 67 %
Neutro Abs: 3.8 10*3/uL (ref 1.7–7.7)
PLATELETS: 203 10*3/uL (ref 150–400)
RBC: 4.45 MIL/uL (ref 3.87–5.11)
RDW: 15 % (ref 11.5–15.5)
WBC: 5.6 10*3/uL (ref 4.0–10.5)

## 2017-05-07 MED ORDER — ACETAMINOPHEN 500 MG PO TABS
1000.0000 mg | ORAL_TABLET | Freq: Once | ORAL | Status: AC
  Administered 2017-05-07: 1000 mg via ORAL
  Filled 2017-05-07: qty 2

## 2017-05-07 NOTE — ED Notes (Signed)
Bed: FG18 Expected date:  Expected time:  Means of arrival:  Comments: 83 yo fall/hip pain

## 2017-05-07 NOTE — ED Notes (Signed)
Dr. Ellin Mayhew reports he and Topher RN stood patient up to assess patient to bear weight.

## 2017-05-07 NOTE — ED Provider Notes (Signed)
[facility] DEPT [facility] Provider Note   CSN: [id number] Arrival date & time: 05/07/17  1329     History   Chief Complaint Chief Complaint  Patient presents with  . Fall  . Hip Pain    HPI [name] is a 83 y.o. female.  83 yo F with a chief complaint of fall. Per the nursing home she rolled out of bed this morning. Found her on the floor. Complaining of right lateral leg pain. Also struck the front of her head. Denies LOC. Dementia limits history. Level V caveat dementia.   The history is provided by the patient.  Fall  This is a new problem. The current episode started 3 to 5 hours ago. The problem occurs constantly. The problem has not changed since onset.Pertinent negatives include no chest pain, no headaches and no shortness of breath. Nothing aggravates the symptoms. Nothing relieves the symptoms. She has tried nothing for the symptoms. The treatment provided no relief.  Hip Pain  Pertinent negatives include no chest pain, no headaches and no shortness of breath.    Past Medical History:  Diagnosis Date  . Anxiety   . Arthritis   . Colon cancer ([place])   . Dementia   . Fibromyalgia   . GERD (gastroesophageal reflux disease)   . Hypertension   . Ovarian cancer [facility])     Patient Active Problem List   Diagnosis Date Noted  . DNR (do not resuscitate) 01/25/2015  . Weakness generalized 01/25/2015  . Palliative care encounter 01/25/2015  . UTI (urinary tract infection) 01/24/2015  . Hypoxia 01/23/2015  . Elevated troponin 01/23/2015  . CKD (chronic kidney disease) stage 3, GFR 30-59 ml/min 01/23/2015  . Chronic anemia 01/23/2015  . SOB (shortness of breath) 01/23/2015  . Cough     Past Surgical History:  Procedure Laterality Date  . ABDOMINAL HYSTERECTOMY    . COLON SURGERY      OB History    No data available       Home Medications    Prior to Admission medications   Medication Sig Start Date End Date Taking? Authorizing Provider  ALPRAZolam  Duanne Moron) 0.25 MG tablet Take 2 tablets (0.5 mg total) by mouth every morning. For nerves Patient taking differently: Take 0.0125 mg by mouth every morning. For nerves 01/26/15  Yes Ranga, Simbiso, MD  loratadine (CLARITIN) 10 MG tablet Take 10 mg by mouth daily.   Yes [provider]  mirtazapine (REMERON) 7.5 MG tablet Take 7.5 mg by mouth at bedtime.   Yes [provider]  ondansetron (ZOFRAN) 8 MG tablet Take 8 mg by mouth 3 (three) times daily as needed for nausea or vomiting.    Yes [provider]  oxyCODONE-acetaminophen (PERCOCET/ROXICET) 5-325 MG tablet Take 1 tablet by mouth 3 (three) times daily. 11/20/15  Yes Domenic Moras, PA-C  OXYGEN Inhale 2 L into the lungs continuous.   Yes [provider]  ranitidine (ZANTAC) 150 MG tablet Take 150 mg by mouth 2 (two) times daily.   Yes [provider]  senna-docusate (SENOKOT-S) 8.6-50 MG per tablet Take 1 tablet by mouth daily. 01/26/15  Yes Ranga, Simbiso, MD  sertraline (ZOLOFT) 25 MG tablet Take 75 mg by mouth at bedtime.   Yes [provider]  Skin Protectants, Misc. (EUCERIN) cream Apply 1 application topically 2 (two) times daily. Applies to lower legs   Yes [provider]  cephALEXin (KEFLEX) 500 MG capsule Take 1 capsule (500 mg total) by mouth  4 (four) times daily. Patient not taking: Reported on 05/07/2017 11/20/15   Domenic Moras, PA-C    Family History No family history on file.  Social History Social History  Substance Use Topics  . Smoking status: Never Smoker  . Smokeless tobacco: Never Used  . Alcohol use Yes     Allergies   Patient has no known allergies.   Review of Systems Review of Systems  Constitutional: Negative for chills and fever.  HENT: Negative for congestion and rhinorrhea.   Eyes: Negative for redness and visual disturbance.  Respiratory: Negative for shortness of breath and wheezing.   Cardiovascular: Negative for chest pain and  palpitations.  Gastrointestinal: Negative for nausea and vomiting.  Genitourinary: Negative for dysuria and urgency.  Musculoskeletal: Negative for arthralgias and myalgias.  Skin: Negative for pallor and wound.  Neurological: Negative for dizziness and headaches.     Physical Exam Updated Vital Signs BP (!) 150/78 (BP Location: Left Arm)   Pulse 74   Temp 98.4 F (36.9 C) (Oral)   Resp 20   Ht 5\' 2"  (1.575 m)   Wt 120 lb (54.4 kg)   SpO2 99%   BMI 21.95 kg/m   Physical Exam  Constitutional: She is oriented to person, place, and time. She appears well-developed and well-nourished. No distress.  HENT:  Head: Normocephalic and atraumatic.  Eyes: EOM are normal. Pupils are equal, round, and reactive to light.  Neck: Normal range of motion. Neck supple.  Cardiovascular: Normal rate and regular rhythm.  Exam reveals no gallop and no friction rub.   No murmur heard. Pulmonary/Chest: Effort normal. She has no wheezes. She has no rales.  Abdominal: Soft. She exhibits no distension. There is no tenderness.  Musculoskeletal: She exhibits tenderness (to the right upper leg, pain with internal external rotation). She exhibits no edema.  PMS intact distally  Neurological: She is alert and oriented to person, place, and time.  Skin: Skin is warm and dry. She is not diaphoretic.  Psychiatric: She has a normal mood and affect. Her behavior is normal.  Nursing note and vitals reviewed.    ED Treatments / Results  Labs (all labs ordered are listed, but only abnormal results are displayed) Labs Reviewed  BASIC METABOLIC PANEL - Abnormal; Notable for the following:       Result Value   Glucose, Bld 107 (*)    Creatinine, Ser 1.19 (*)    GFR calc non Af Amer 37 (*)    GFR calc Af Amer 43 (*)    All other components within normal limits  CBC WITH DIFFERENTIAL/PLATELET    EKG  EKG Interpretation  Date/Time:  Tuesday May 07 2017 13:46:14 EDT Ventricular Rate:  82 PR Interval:      QRS Duration: 128 QT Interval:  399 QTC Calculation: 466 R Axis:   3 Text Interpretation:  Atrial fibrillation Left bundle branch block No significant change since last tracing Confirmed by Jakaya Jacobowitz MD, DANIEL [phone]) on 05/07/2017 2:17:16 PM       Radiology Dg Chest 1 View  Result Date: 05/07/2017 CLINICAL DATA:  Right hip pain after fall.  Shortness of breath. EXAM: CHEST 1 VIEW COMPARISON:  Chest radiograph 01/23/2015 FINDINGS: Unchanged cardiomediastinal contours. Diffusely increased pulmonary markings, unchanged. Mild left basilar atelectasis. No focal consolidation or pulmonary edema. IMPRESSION: No active disease. Electronically Signed   By: Ulyses Jarred M.D.   On: 05/07/2017 15:32   Ct Head Wo Contrast  Result Date: 05/07/2017 CLINICAL DATA:  Fall  with head injury EXAM: CT HEAD WITHOUT CONTRAST TECHNIQUE: Contiguous axial images were obtained from the base of the skull through the vertex without intravenous contrast. COMPARISON:  CT head 01/23/2015 FINDINGS: Brain: Moderate atrophy. Chronic microvascular ischemic change in the white matter. Negative for acute infarct, hemorrhage, mass. Vascular: Negative for hyperdense vessel. Skull: Negative Sinuses/Orbits: Soft tissue mass or polyp in the nasopharynx measures 9 x 16 mm. This was also present previously and is unchanged. Direct visualization and biopsy if indicated. Other: None IMPRESSION: Moderate atrophy with mild chronic microvascular ischemic change. No acute intracranial abnormality 16 x 9 mm soft tissue mass in the left nasopharynx. This may represent a mass or polyp. Review MRI from 06/27/2011 reveals mild interval growth. Electronically Signed   By: Franchot Gallo M.D.   On: 05/07/2017 14:59   Dg Hip Unilat W Or Wo Pelvis 2-3 Views Right  Result Date: 05/07/2017 CLINICAL DATA:  [place] at nursing facility, RIGHT hip pain EXAM: DG HIP (WITH OR WITHOUT PELVIS) 2-3V RIGHT COMPARISON:  11/20/2015 FINDINGS: Diffuse osseous demineralization.  Advanced degenerative changes of the RIGHT hip joint with joint space narrowing, spur formation flattening of the cranial margin of the RIGHT femoral head, and superior subluxation of the RIGHT femoral head with pseudoacetabularization. Appearance of the proximal RIGHT femur is unchanged since the previous exam. No definite acute fracture, dislocation, or bone destruction. IMPRESSION: Advanced osteoarthritic changes of the RIGHT hip joint as above. Osseous demineralization. No definite acute bony abnormalities. Electronically Signed   By: Lavonia Dana M.D.   On: 05/07/2017 15:32   Dg Femur, Min 2 Views Right  Result Date: 05/07/2017 CLINICAL DATA:  [place] [address] at nursing facility, frontal hematoma, RIGHT hip pain EXAM: RIGHT FEMUR 2 VIEWS COMPARISON:  None FINDINGS: RIGHT hip imaged and reported separately. Diffuse osseous demineralization. Joint space narrowing RIGHT knee. No acute fracture, dislocation, or bone destruction. Scattered atherosclerotic calcifications. IMPRESSION: Osseous demineralization with mild degenerative changes RIGHT knee. No acute abnormalities. Electronically Signed   By: Lavonia Dana M.D.   On: 05/07/2017 15:29    Procedures Procedures (including critical care time)  Medications Ordered in ED Medications  acetaminophen (TYLENOL) tablet 1,000 mg (1,000 mg Oral Given 05/07/17 1415)     Initial Impression / Assessment and Plan / ED Course  I have reviewed the triage vital signs and the nursing notes.  Pertinent labs & imaging results that were available during my care of the patient were reviewed by me and considered in my medical decision making (see chart for details).     83 yo F With a chief complaint of a fall out of bed. Patient has shortening and pain to the right hip suspect fracture. Patient is wheelchair only nonambulatory.  Xray negative for fx, ct head unremarkable.  When reassessed patient thinks she has chronic shortening of that leg.  Able to bear weight without  significant pain.      I have discussed the diagnosis/risks/treatment options with the patient and believe the pt to be eligible for discharge home to follow-up with PCP. We also discussed returning to the ED immediately if new or worsening sx occur. We discussed the sx which are most concerning (e.g., sudden worsening pain, fever, inability to tolerate by mouth) that necessitate immediate return. Medications administered to the patient during their visit and any new prescriptions provided to the patient are listed below.  Medications given during this visit Medications  acetaminophen (TYLENOL) tablet 1,000 mg (1,000 mg Oral Given 05/07/17 1415)  The patient appears reasonably screen and/or stabilized for discharge and I doubt any other medical condition or other [facility] requiring further screening, evaluation, or treatment in the ED at this time prior to discharge.    Final Clinical Impressions(s) / ED Diagnoses   Final diagnoses:  Right hip pain  Fall, initial encounter    New Prescriptions Discharge Medication List as of 05/07/2017  4:05 PM       Deno Etienne, DO 05/08/17 [phone]

## 2017-05-07 NOTE — ED Notes (Signed)
Gave report to [facility] for transportation.

## 2017-05-07 NOTE — ED Notes (Signed)
Patient transported to CT 

## 2017-05-07 NOTE — ED Notes (Signed)
Notified [facility] for transportation back to [facility]

## 2017-05-07 NOTE — ED Triage Notes (Signed)
Per EMS, pt had fall at nursing facility [facility]) today. Pt has hematoma to forehead and complains of right hip pain. No shortening or rotation noted to hip.

## 2017-05-07 NOTE — Discharge Planning (Signed)
Pt currently active with [facility] for home hospice services.  Resumption of care requested. Bambi Anderson of [facility] notified.  No DME needs identified at this time.

## 2019-05-15 ENCOUNTER — Other Ambulatory Visit: Payer: Self-pay

## 2019-05-15 ENCOUNTER — Emergency Department (HOSPITAL_COMMUNITY): Payer: Medicare Other

## 2019-05-15 ENCOUNTER — Observation Stay (HOSPITAL_COMMUNITY)
Admission: EM | Admit: 2019-05-15 | Discharge: 2019-05-16 | Disposition: A | Discharge status: Home / Self Care | Payer: Medicare Other | Attending: Internal Medicine | Admitting: Internal Medicine

## 2019-05-15 ENCOUNTER — Encounter (HOSPITAL_COMMUNITY): Payer: Self-pay

## 2019-05-15 DIAGNOSIS — L89311 Pressure ulcer of right buttock, stage 1: Secondary | ICD-10-CM | POA: Diagnosis not present

## 2019-05-15 DIAGNOSIS — Z1159 Encounter for screening for other viral diseases: Secondary | ICD-10-CM | POA: Insufficient documentation

## 2019-05-15 DIAGNOSIS — L89891 Pressure ulcer of other site, stage 1: Secondary | ICD-10-CM | POA: Diagnosis not present

## 2019-05-15 DIAGNOSIS — N3000 Acute cystitis without hematuria: Secondary | ICD-10-CM

## 2019-05-15 DIAGNOSIS — F419 Anxiety disorder, unspecified: Secondary | ICD-10-CM | POA: Diagnosis not present

## 2019-05-15 DIAGNOSIS — Z85038 Personal history of other malignant neoplasm of large intestine: Secondary | ICD-10-CM | POA: Insufficient documentation

## 2019-05-15 DIAGNOSIS — F0390 Unspecified dementia without behavioral disturbance: Secondary | ICD-10-CM | POA: Insufficient documentation

## 2019-05-15 DIAGNOSIS — N183 Chronic kidney disease, stage 3 unspecified: Secondary | ICD-10-CM | POA: Diagnosis present

## 2019-05-15 DIAGNOSIS — Z7289 Other problems related to lifestyle: Secondary | ICD-10-CM | POA: Diagnosis not present

## 2019-05-15 DIAGNOSIS — N179 Acute kidney failure, unspecified: Secondary | ICD-10-CM | POA: Diagnosis not present

## 2019-05-15 DIAGNOSIS — Z79899 Other long term (current) drug therapy: Secondary | ICD-10-CM | POA: Insufficient documentation

## 2019-05-15 DIAGNOSIS — R5381 Other malaise: Secondary | ICD-10-CM | POA: Insufficient documentation

## 2019-05-15 DIAGNOSIS — R404 Transient alteration of awareness: Secondary | ICD-10-CM

## 2019-05-15 DIAGNOSIS — N390 Urinary tract infection, site not specified: Principal | ICD-10-CM | POA: Insufficient documentation

## 2019-05-15 DIAGNOSIS — D631 Anemia in chronic kidney disease: Secondary | ICD-10-CM | POA: Diagnosis not present

## 2019-05-15 DIAGNOSIS — R238 Other skin changes: Secondary | ICD-10-CM | POA: Diagnosis not present

## 2019-05-15 DIAGNOSIS — Z8543 Personal history of malignant neoplasm of ovary: Secondary | ICD-10-CM | POA: Diagnosis not present

## 2019-05-15 DIAGNOSIS — Z66 Do not resuscitate: Secondary | ICD-10-CM | POA: Diagnosis not present

## 2019-05-15 DIAGNOSIS — K219 Gastro-esophageal reflux disease without esophagitis: Secondary | ICD-10-CM | POA: Diagnosis not present

## 2019-05-15 DIAGNOSIS — L8990 Pressure ulcer of unspecified site, unspecified stage: Secondary | ICD-10-CM

## 2019-05-15 DIAGNOSIS — M797 Fibromyalgia: Secondary | ICD-10-CM | POA: Insufficient documentation

## 2019-05-15 DIAGNOSIS — M1990 Unspecified osteoarthritis, unspecified site: Secondary | ICD-10-CM | POA: Insufficient documentation

## 2019-05-15 DIAGNOSIS — Z9981 Dependence on supplemental oxygen: Secondary | ICD-10-CM | POA: Insufficient documentation

## 2019-05-15 DIAGNOSIS — D649 Anemia, unspecified: Secondary | ICD-10-CM | POA: Diagnosis present

## 2019-05-15 DIAGNOSIS — J9611 Chronic respiratory failure with hypoxia: Secondary | ICD-10-CM | POA: Diagnosis not present

## 2019-05-15 DIAGNOSIS — R531 Weakness: Secondary | ICD-10-CM | POA: Insufficient documentation

## 2019-05-15 DIAGNOSIS — E860 Dehydration: Secondary | ICD-10-CM | POA: Diagnosis present

## 2019-05-15 DIAGNOSIS — I129 Hypertensive chronic kidney disease with stage 1 through stage 4 chronic kidney disease, or unspecified chronic kidney disease: Secondary | ICD-10-CM | POA: Diagnosis not present

## 2019-05-15 DIAGNOSIS — Z79891 Long term (current) use of opiate analgesic: Secondary | ICD-10-CM | POA: Insufficient documentation

## 2019-05-15 LAB — CBC WITH DIFFERENTIAL/PLATELET
Abs Immature Granulocytes: 0.05 10*3/uL (ref 0.00–0.07)
Basophils Absolute: 0 10*3/uL (ref 0.0–0.1)
Basophils Relative: 0 %
Eosinophils Absolute: 0 10*3/uL (ref 0.0–0.5)
Eosinophils Relative: 0 %
HCT: 42.9 % (ref 36.0–46.0)
Hemoglobin: 13.8 g/dL (ref 12.0–15.0)
Immature Granulocytes: 1 %
Lymphocytes Relative: 16 %
Lymphs Abs: 1.3 10*3/uL (ref 0.7–4.0)
MCH: 30.1 pg (ref 26.0–34.0)
MCHC: 32.2 g/dL (ref 30.0–36.0)
MCV: 93.5 fL (ref 80.0–100.0)
Monocytes Absolute: 0.7 10*3/uL (ref 0.1–1.0)
Monocytes Relative: 8 %
Neutro Abs: 6 10*3/uL (ref 1.7–7.7)
Neutrophils Relative %: 75 %
Platelets: 206 10*3/uL (ref 150–400)
RBC: 4.59 MIL/uL (ref 3.87–5.11)
RDW: 14.6 % (ref 11.5–15.5)
WBC: 8 10*3/uL (ref 4.0–10.5)
nRBC: 0 % (ref 0.0–0.2)

## 2019-05-15 LAB — COMPREHENSIVE METABOLIC PANEL
ALT: 9 U/L (ref 0–44)
AST: 19 U/L (ref 15–41)
Albumin: 3.9 g/dL (ref 3.5–5.0)
Alkaline Phosphatase: 72 U/L (ref 38–126)
Anion gap: 9 (ref 5–15)
BUN: 44 mg/dL — ABNORMAL HIGH (ref 8–23)
CO2: 24 mmol/L (ref 22–32)
Calcium: 10.9 mg/dL — ABNORMAL HIGH (ref 8.9–10.3)
Chloride: 106 mmol/L (ref 98–111)
Creatinine, Ser: 1.45 mg/dL — ABNORMAL HIGH (ref 0.44–1.00)
GFR calc Af Amer: 34 mL/min — ABNORMAL LOW (ref >60)
GFR calc non Af Amer: 30 mL/min — ABNORMAL LOW (ref >60)
Glucose, Bld: 112 mg/dL — ABNORMAL HIGH (ref 70–99)
Potassium: 3.4 mmol/L — ABNORMAL LOW (ref 3.5–5.1)
Sodium: 139 mmol/L (ref 135–145)
Total Bilirubin: 0.8 mg/dL (ref 0.3–1.2)
Total Protein: 7.5 g/dL (ref 6.5–8.1)

## 2019-05-15 LAB — URINALYSIS, ROUTINE W REFLEX MICROSCOPIC
Bilirubin Urine: NEGATIVE
Glucose, UA: NEGATIVE mg/dL
Ketones, ur: 20 mg/dL — AB
Nitrite: NEGATIVE
Protein, ur: 100 mg/dL — AB
RBC / HPF: >50 RBC/hpf — ABNORMAL HIGH (ref 0–5)
Specific Gravity, Urine: 1.016 (ref 1.005–1.030)
WBC, UA: >50 WBC/hpf — ABNORMAL HIGH (ref 0–5)
pH: 6 (ref 5.0–8.0)

## 2019-05-15 LAB — SARS CORONAVIRUS 2 BY RT PCR (HOSPITAL ORDER, PERFORMED IN ~~LOC~~ HOSPITAL LAB): SARS Coronavirus 2: NEGATIVE

## 2019-05-15 MED ORDER — SODIUM CHLORIDE 0.9 % IV BOLUS
1000.0000 mL | Freq: Once | INTRAVENOUS | Status: AC
  Administered 2019-05-15: 14:00:00 1000 mL via INTRAVENOUS

## 2019-05-15 MED ORDER — ENOXAPARIN SODIUM 30 MG/0.3ML ~~LOC~~ SOLN
30.0000 mg | SUBCUTANEOUS | Status: DC
  Administered 2019-05-16: 30 mg via SUBCUTANEOUS
  Filled 2019-05-15: qty 0.3

## 2019-05-15 MED ORDER — SENNOSIDES-DOCUSATE SODIUM 8.6-50 MG PO TABS
1.0000 | ORAL_TABLET | Freq: Every day | ORAL | Status: DC
  Administered 2019-05-16: 1 via ORAL
  Filled 2019-05-15: qty 1

## 2019-05-15 MED ORDER — SERTRALINE HCL 50 MG PO TABS
75.0000 mg | ORAL_TABLET | Freq: Every day | ORAL | Status: DC
  Administered 2019-05-15: 75 mg via ORAL
  Filled 2019-05-15: qty 1

## 2019-05-15 MED ORDER — MIRTAZAPINE 15 MG PO TABS
7.5000 mg | ORAL_TABLET | Freq: Every day | ORAL | Status: DC
  Administered 2019-05-15: 7.5 mg via ORAL
  Filled 2019-05-15: qty 1

## 2019-05-15 MED ORDER — FAMOTIDINE 20 MG PO TABS
20.0000 mg | ORAL_TABLET | Freq: Every day | ORAL | Status: DC
  Administered 2019-05-15: 20 mg via ORAL
  Filled 2019-05-15: qty 1

## 2019-05-15 MED ORDER — OXYCODONE-ACETAMINOPHEN 5-325 MG PO TABS
1.0000 | ORAL_TABLET | Freq: Three times a day (TID) | ORAL | Status: DC
  Administered 2019-05-15 – 2019-05-16 (×3): 1 via ORAL
  Filled 2019-05-15 (×3): qty 1

## 2019-05-15 MED ORDER — BENEPROTEIN PO POWD: 1.0000 | Freq: Two times a day (BID) | ORAL | Status: DC

## 2019-05-15 MED ORDER — ACETAMINOPHEN 325 MG PO TABS: 650.0000 mg | ORAL_TABLET | Freq: Four times a day (QID) | ORAL | Status: DC | PRN

## 2019-05-15 MED ORDER — SENNA 8.6 MG PO TABS
1.0000 | ORAL_TABLET | Freq: Every day | ORAL | Status: DC
  Administered 2019-05-16: 8.6 mg via ORAL
  Filled 2019-05-15: qty 1

## 2019-05-15 MED ORDER — ACETAMINOPHEN 650 MG RE SUPP: 650.0000 mg | Freq: Four times a day (QID) | RECTAL | Status: DC | PRN

## 2019-05-15 MED ORDER — FOLIC ACID 1 MG PO TABS
1.0000 mg | ORAL_TABLET | Freq: Every day | ORAL | Status: DC
  Administered 2019-05-16: 1 mg via ORAL
  Filled 2019-05-15: qty 1

## 2019-05-15 MED ORDER — ONDANSETRON HCL 4 MG PO TABS: 8.0000 mg | ORAL_TABLET | Freq: Three times a day (TID) | ORAL | Status: DC | PRN

## 2019-05-15 MED ORDER — SODIUM CHLORIDE 0.9 % IV SOLN
1.0000 g | INTRAVENOUS | Status: DC
  Filled 2019-05-15: qty 10

## 2019-05-15 MED ORDER — LORATADINE 10 MG PO TABS
10.0000 mg | ORAL_TABLET | Freq: Every day | ORAL | Status: DC
  Administered 2019-05-16: 10 mg via ORAL
  Filled 2019-05-15: qty 1

## 2019-05-15 MED ORDER — SODIUM CHLORIDE 0.9 % IV SOLN
1.0000 g | Freq: Once | INTRAVENOUS | Status: DC
  Administered 2019-05-15: 1 g via INTRAVENOUS

## 2019-05-15 MED ORDER — RESOURCE INSTANT PROTEIN PO PWD PACKET
1.0000 | Freq: Two times a day (BID) | ORAL | Status: DC
  Filled 2019-05-15 (×2): qty 6

## 2019-05-15 MED ORDER — PROBIOTIC ACIDOPHILUS PO CAPS: 1.0000 | ORAL_CAPSULE | Freq: Two times a day (BID) | ORAL | Status: DC

## 2019-05-15 MED ORDER — RISAQUAD PO CAPS
1.0000 | ORAL_CAPSULE | Freq: Every day | ORAL | Status: DC
  Administered 2019-05-16: 11:00:00 1 via ORAL
  Filled 2019-05-15: qty 1

## 2019-05-15 NOTE — ED Notes (Signed)
Bed: WA01 Expected date:  Expected time:  Means of arrival:  Comments: EMS-possible UTI

## 2019-05-15 NOTE — ED Provider Notes (Signed)
Patient is seen after signout from prior ED provider.  Patient with evidence of dehydration and alteration in mental status secondary to UTI.  She would likely benefit from admission for IV fluids and IV antibiotics.  Patient's case discussed with hospitalist service who will evaluate for admission.   Valarie Merino, MD 05/15/19 [phone]

## 2019-05-15 NOTE — H&P (Signed)
History and Physical    [name] PTW:[id number] DOB: 1918/04/11 DOA: 05/15/2019  PCP: Wenda Low, MD  Patient coming from: SNF   I have personally briefly reviewed patient's old medical records available.   Chief Complaint: altered mentation, not eating well.   HPI: [name] is a 85 y.o. female with medical history significant of dementia, arthritis, chronic hypoxic respiratory failure on 2 L of oxygen and chronic debility, GERD who is presenting to the emergency room from long-term nursing home with lethargic and recently treated UTI.  Patient is poor historian due to advanced dementia.  History was taken from nursing home records as well as emergency room interaction with EMS.  According to the records, she was not feeling quite well for the last 3 to 4 days.  She was diagnosed with UTI and started on Keflex at the nursing home 3 days ago.  Patient was noted to be more lethargic and not eating well and also noted to be somehow confused that prompted them to transfer her to the ER today.  Patient herself is unable to give any history. ED Course: Hemodynamically stable in the ER.  Urine looks grossly infected.  She is on 2 L of oxygen which she is at nursing home.  Afebrile.  WBC count is normal.  Patient looks clinically dry.  Her BUN is 44, creatinine 1.45.  Patient was given some IV fluids.  Requested monitoring in the hospital because of failed outpatient therapy for UTI as well as dehydration.  Review of Systems: Unable to take due to advanced dementia. Called patient's daughter on the number provided.  Unable to reach and no identifiable voice mailbox.   Past Medical History:  Diagnosis Date  . Anxiety   . Arthritis   . Colon cancer ([place])   . Dementia ([place])   . Fibromyalgia   . GERD (gastroesophageal reflux disease)   . Hypertension   . Ovarian cancer [facility])     Past Surgical History:  Procedure Laterality Date  . ABDOMINAL HYSTERECTOMY    . COLON SURGERY       reports that she has never smoked. She has never used smokeless tobacco. She reports current alcohol use. She reports that she does not use drugs.  No Known Allergies  History reviewed. No pertinent family history.   Prior to Admission medications   Medication Sig Start Date End Date Taking? Authorizing Provider  famotidine (PEPCID) 20 MG tablet Take 20 mg by mouth at bedtime. 04/28/19  Yes [provider]  folic acid (FOLVITE) 1 MG tablet Take 1 mg by mouth daily.   Yes [provider]  Lactobacillus (PROBIOTIC ACIDOPHILUS) CAPS Take 1 capsule by mouth 2 (two) times a day.   Yes [provider]  loratadine (CLARITIN) 10 MG tablet Take 10 mg by mouth daily.   Yes [provider]  mirtazapine (REMERON) 7.5 MG tablet Take 7.5 mg by mouth at bedtime.   Yes [provider]  Multiple Vitamins-Minerals (MULTIVITAMIN ADULT PO) Take 1 capsule by mouth daily.   Yes [provider]  oxyCODONE-acetaminophen (PERCOCET/ROXICET) 5-325 MG tablet Take 1 tablet by mouth 3 (three) times daily. 11/20/15  Yes Domenic Moras, PA-C  OXYGEN Inhale 2 L into the lungs continuous.   Yes [provider]  protein supplement (RESOURCE BENEPROTEIN) POWD Take 1 Scoop by mouth 2 (two) times a day.   Yes [provider]  senna (SENOKOT) 8.6 MG TABS tablet Take 1 tablet by mouth daily.  Yes [provider]  senna-docusate (SENOKOT-S) 8.6-50 MG per tablet Take 1 tablet by mouth daily. 01/26/15  Yes Ranga, Simbiso, MD  sertraline (ZOLOFT) 25 MG tablet Take 75 mg by mouth at bedtime.   Yes [provider]  Skin Protectants, Misc. (EUCERIN) cream Apply 1 application topically 2 (two) times daily. Applies to lower legs   Yes [provider]  ondansetron (ZOFRAN) 8 MG tablet Take 8 mg by mouth 3 (three) times daily as needed for nausea or vomiting.     [provider]  ranitidine (ZANTAC) 150 MG tablet Take 150 mg by mouth 2 (two)  times daily.    [provider]    Physical Exam: Vitals:   05/15/19 1405 05/15/19 1430 05/15/19 1530 05/15/19 1630  BP: (!) 146/124 101/72 114/66 128/86  Pulse: (!) 122  80 80  Resp: 20  18 18   Temp:      TempSrc:      SpO2: 96%  94% 94%    Constitutional: NAD, calm, comfortable Vitals:   05/15/19 1405 05/15/19 1430 05/15/19 1530 05/15/19 1630  BP: (!) 146/124 101/72 114/66 128/86  Pulse: (!) 122  80 80  Resp: 20  18 18   Temp:      TempSrc:      SpO2: 96%  94% 94%   Eyes: PERRL, lids and conjunctivae normal, elderly frail female on 2 L of oxygen.  Not with any distress. ENMT: Mucous membranes are dry . Posterior pharynx clear of any exudate or lesions.Normal dentition.  Neck: normal, supple, no masses, no thyromegaly Respiratory: clear to auscultation bilaterally, no wheezing, no crackles. Normal respiratory effort. No accessory muscle use.  Cardiovascular: Regular rate and rhythm, no murmurs / rubs / gallops. No extremity edema. 2+ pedal pulses. No carotid bruits.  Abdomen: no tenderness, no masses palpated. No hepatosplenomegaly. Bowel sounds positive.  Musculoskeletal: no clubbing / cyanosis. No joint deformity upper and lower extremities. Good ROM, no contractures. Normal muscle tone.  Skin: no rashes, lesions, ulcers. No induration Neurologic: CN 2-12 grossly intact. Sensation intact, DTR normal. Strength equal in all 4.  Psychiatric: Normal judgment and insight. Alert and oriented x 1.  Flat affect.    Labs on Admission: I have personally reviewed following labs and imaging studies  CBC: Recent Labs  Lab 05/15/19 1351  WBC 8.0  NEUTROABS 6.0  HGB 13.8  HCT 42.9  MCV 93.5  PLT 182   Basic Metabolic Panel: Recent Labs  Lab 05/15/19 1351  NA 139  K 3.4*  CL 106  CO2 24  GLUCOSE 112*  BUN 44*  CREATININE 1.45*  CALCIUM 10.9*   GFR: CrCl cannot be calculated (Unknown ideal weight.). Liver Function Tests: Recent Labs  Lab 05/15/19 1351   AST 19  ALT 9  ALKPHOS 72  BILITOT 0.8  PROT 7.5  ALBUMIN 3.9   No results for input(s): LIPASE, AMYLASE in the last 168 hours. No results for input(s): AMMONIA in the last 168 hours. Coagulation Profile: No results for input(s): INR, PROTIME in the last 168 hours. Cardiac Enzymes: No results for input(s): CKTOTAL, CKMB, CKMBINDEX, TROPONINI in the last 168 hours. BNP (last 3 results) No results for input(s): PROBNP in the last 8760 hours. HbA1C: No results for input(s): HGBA1C in the last 72 hours. CBG: No results for input(s): GLUCAP in the last 168 hours. Lipid Profile: No results for input(s): CHOL, HDL, LDLCALC, TRIG, CHOLHDL, LDLDIRECT in the last 72 hours. Thyroid Function Tests: No results for input(s):  TSH, T4TOTAL, FREET4, T3FREE, THYROIDAB in the last 72 hours. Anemia Panel: No results for input(s): VITAMINB12, FOLATE, FERRITIN, TIBC, IRON, RETICCTPCT in the last 72 hours. Urine analysis:    Component Value Date/Time   COLORURINE YELLOW 05/15/2019 1404   APPEARANCEUR TURBID (A) 05/15/2019 1404   LABSPEC 1.016 05/15/2019 1404   PHURINE 6.0 05/15/2019 1404   GLUCOSEU NEGATIVE 05/15/2019 1404   HGBUR LARGE (A) 05/15/2019 1404   BILIRUBINUR NEGATIVE 05/15/2019 1404   KETONESUR 20 (A) 05/15/2019 1404   PROTEINUR 100 (A) 05/15/2019 1404   UROBILINOGEN 1.0 01/23/2015 1945   NITRITE NEGATIVE 05/15/2019 1404   LEUKOCYTESUR MODERATE (A) 05/15/2019 1404    Radiological Exams on Admission: Ct Head Wo Contrast  Result Date: 05/15/2019 CLINICAL DATA:  Altered mental status.  Recently diagnosed with UTI. EXAM: CT HEAD WITHOUT CONTRAST TECHNIQUE: Contiguous axial images were obtained from the base of the skull through the vertex without intravenous contrast. COMPARISON:  CT head dated May 07, 2017. FINDINGS: Brain: No evidence of acute infarction, hemorrhage, hydrocephalus, extra-axial collection or mass lesion/mass effect. Stable atrophy and mild chronic microvascular  ischemic changes. Vascular: Calcified atherosclerosis at the skullbase. No hyperdense vessel. Skull: Normal. Negative for fracture or focal lesion. Sinuses/Orbits: No acute finding. Slowly enlarging soft tissue mass or polyp in the left nasopharynx, now measuring 2.1 x 1.3 cm, previously 1.6 x 0.9 cm. Other: None. IMPRESSION: 1.  No acute intracranial abnormality. 2. Slowly enlarging 2.1 x 1.3 cm soft tissue mass or polyp in the left nasopharynx. Correlate with direct visualization. Electronically Signed   By: Titus Dubin M.D.   On: 05/15/2019 15:57   Dg Chest Port 1 View  Result Date: 05/15/2019 CLINICAL DATA:  Altered mental status EXAM: PORTABLE CHEST 1 VIEW COMPARISON:  05/07/2017 FINDINGS: The heart size and mediastinal contours are within normal limits. Both lungs are clear. Apically scarring calcification left apex unchanged. The visualized skeletal structures are unremarkable. IMPRESSION: No active disease. Electronically Signed   By: Franchot Gallo M.D.   On: 05/15/2019 15:23    Assessment/Plan Principal Problem:   UTI (urinary tract infection) Active Problems:   CKD (chronic kidney disease) stage 3, GFR 30-59 ml/min (H[facility])   Chronic anemia   Weakness generalized   AKI (acute kidney injury) ([place])     1.  Acute UTI present on admission: Failed outpatient therapy.  Agree with treatment with IV antibiotics.  We will continue Rocephin pending final cultures.  2.  Acute kidney injury with moderate dehydration with underlying chronic kidney disease stage III: Probably due to #1.  She received 1 L of IV fluid bolus in the ER.  Will hold off on further fluid.  Will recheck electrolytes in the morning.  3.  Chronic anemia: Stable.  4.  Chronic debility/dementia: On supportive treatment.  From long-term nursing home.   DVT prophylaxis: Lovenox subcu Code Status: DNR/DNI Family Communication: None, called daughter , no answer. Disposition Plan: Back to nursing home Consults called:  None Admission status: Observation.   Barb Merino MD [facility] Pager [phone]  If 7PM-7AM, please contact night-coverage [email or web address] Password [email or web address]  05/15/2019, 6:18 PM

## 2019-05-15 NOTE — Progress Notes (Signed)
Per day shift report, pt arrived to floor approximately 10 minutes prior to shift change. Pt bathed, skin assessment completed by off-going & oncoming RN's. Multiple skin issues noted, complete documentation as follows (and in initial assessment): B/L heels: intact, but boggy; pink foam heel protectors placed, feet elevated on pillow. Left foot 2nd toe- callous/blister- OTA. Right foot, 2nd/3rd toes: blackened wounds resembling callouses or hardened blisters; OTA, no drainage noted. B/L LE's: discolored, multiple bruises that go circumferentially in various stages of healing; ranging from black to pale yellow. B/L knees: +1 pitting edema, R>L, OTA. Left buttock near gluteal fold: small skin tear or friction rub approximately the size of a nickel, pink, no drainage; covered with pink foam. Right buttock, near gluteal fold: reddened area, skin unbroken, stage I-covered with pink foam. Mid back, along spine (bony prominence):pressure ulcer, reddened, unblanching, stage I. Covered with pink foam. B/L UE's: scattered bruises, varying stages of healing, OTA. Skin overall is dry and flaky, more flakiness noted to toes/feet, and scattered on arms. No noted skin issues to anterior trunk. WOCN consult to be placed for evaluation of above noted wounds.

## 2019-05-15 NOTE — ED Notes (Signed)
ED TO INPATIENT HANDOFF REPORT  ED Nurse Name and Phone #:   S Name/Age/Gender Nell Range 85 y.o. female Room/Bed: WA01/WA01  Code Status   Code Status: Prior  Home/SNF/Other Nursing Home Patient oriented to: self Is this baseline? Yes   Triage Complete: Triage complete  Chief Complaint possible uti  Triage Note Pt BIB EMS from [facility]. Pt diagnosed with UTI 3 days ago. Pt has decreased oral intake, slower to respond, more lethargic, unable to take med per facility. Pt on 2L O2 at facility. Pt has hx of dementia. Pt alert and verbal.   CBG 124 Temp 97.9 BP 138/64 HR 86 95% 2L   Allergies No Known Allergies  Level of Care/Admitting Diagnosis ED Disposition    ED Disposition Condition Comment   Admit  Hospital Area: [facility] [[id number]]  Level of Care: Med-Surg [16]  Covid Evaluation: N/A  Diagnosis: UTI (urinary tract infection) [466599]  Admitting Physician: Barb Merino [[id number]]  Attending Physician: Barb Merino [[id number]]  PT Class (Do Not Modify): Observation [104]  PT Acc Code (Do Not Modify): Observation [10022]       B Medical/Surgery History Past Medical History:  Diagnosis Date  . Anxiety   . Arthritis   . Colon cancer ([place])   . Dementia ([place])   . Fibromyalgia   . GERD (gastroesophageal reflux disease)   . Hypertension   . Ovarian cancer [facility])    Past Surgical History:  Procedure Laterality Date  . ABDOMINAL HYSTERECTOMY    . COLON SURGERY       A IV Location/Drains/Wounds Patient Lines/Drains/Airways Status   Active Line/Drains/Airways    Name:   Placement date:   Placement time:   Site:   Days:   Peripheral IV 05/15/19 Left Antecubital   05/15/19    -    Antecubital   less than 1          Intake/Output Last 24 hours No intake or output data in the 24 hours ending 05/15/19 1834  Labs/Imaging Results for orders placed or performed during the hospital encounter of 05/15/19 (from  the past 48 hour(s))  CBC with Differential     Status: None   Collection Time: 05/15/19  1:51 PM  Result Value Ref Range   WBC 8.0 4.0 - 10.5 K/uL    Comment: WHITE COUNT CONFIRMED ON SMEAR   RBC 4.59 3.87 - 5.11 MIL/uL   Hemoglobin 13.8 12.0 - 15.0 g/dL   HCT 42.9 36.0 - 46.0 %   MCV 93.5 80.0 - 100.0 fL   MCH 30.1 26.0 - 34.0 pg   MCHC 32.2 30.0 - 36.0 g/dL   RDW 14.6 11.5 - 15.5 %   Platelets 206 150 - 400 K/uL   nRBC 0.0 0.0 - 0.2 %   Neutrophils Relative % 75 %   Neutro Abs 6.0 1.7 - 7.7 K/uL   Lymphocytes Relative 16 %   Lymphs Abs 1.3 0.7 - 4.0 K/uL   Monocytes Relative 8 %   Monocytes Absolute 0.7 0.1 - 1.0 K/uL   Eosinophils Relative 0 %   Eosinophils Absolute 0.0 0.0 - 0.5 K/uL   Basophils Relative 0 %   Basophils Absolute 0.0 0.0 - 0.1 K/uL   Immature Granulocytes 1 %   Abs Immature Granulocytes 0.05 0.00 - 0.07 K/uL    Comment: Performed at [facility], [place] [address] [place], [place] [address]  Comprehensive metabolic panel     Status: Abnormal  Collection Time: 05/15/19  1:51 PM  Result Value Ref Range   Sodium 139 135 - 145 mmol/L   Potassium 3.4 (L) 3.5 - 5.1 mmol/L   Chloride 106 98 - 111 mmol/L   CO2 24 22 - 32 mmol/L   Glucose, Bld 112 (H) 70 - 99 mg/dL   BUN 44 (H) 8 - 23 mg/dL   Creatinine, Ser 1.45 (H) 0.44 - 1.00 mg/dL   Calcium 10.9 (H) 8.9 - 10.3 mg/dL   Total Protein 7.5 6.5 - 8.1 g/dL   Albumin 3.9 3.5 - 5.0 g/dL   AST 19 15 - 41 U/L   ALT 9 0 - 44 U/L   Alkaline Phosphatase 72 38 - 126 U/L   Total Bilirubin 0.8 0.3 - 1.2 mg/dL   GFR calc non Af Amer 30 (L) >60 mL/min   GFR calc Af Amer 34 (L) >60 mL/min   Anion gap 9 5 - 15    Comment: Performed at [facility], [place] [address] [place], [place] [address]  Urinalysis, Routine w reflex microscopic     Status: Abnormal   Collection Time: 05/15/19  2:04 PM  Result Value Ref Range   Color, Urine YELLOW YELLOW   APPearance TURBID (A) CLEAR   Specific  Gravity, Urine 1.016 1.005 - 1.030   pH 6.0 5.0 - 8.0   Glucose, UA NEGATIVE NEGATIVE mg/dL   Hgb urine dipstick LARGE (A) NEGATIVE   Bilirubin Urine NEGATIVE NEGATIVE   Ketones, ur 20 (A) NEGATIVE mg/dL   Protein, ur 100 (A) NEGATIVE mg/dL   Nitrite NEGATIVE NEGATIVE   Leukocytes,Ua MODERATE (A) NEGATIVE   RBC / HPF >50 (H) 0 - 5 RBC/hpf   WBC, UA >50 (H) 0 - 5 WBC/hpf   Bacteria, UA FEW (A) NONE SEEN   Squamous Epithelial / LPF 21-50 0 - 5   WBC Clumps PRESENT    Amorphous Crystal PRESENT     Comment: Performed at [facility], [place] [address], [place], [place] [address]   Ct Head Wo Contrast  Result Date: 05/15/2019 CLINICAL DATA:  Altered mental status.  Recently diagnosed with UTI. EXAM: CT HEAD WITHOUT CONTRAST TECHNIQUE: Contiguous axial images were obtained from the base of the skull through the vertex without intravenous contrast. COMPARISON:  CT head dated May 07, 2017. FINDINGS: Brain: No evidence of acute infarction, hemorrhage, hydrocephalus, extra-axial collection or mass lesion/mass effect. Stable atrophy and mild chronic microvascular ischemic changes. Vascular: Calcified atherosclerosis at the skullbase. No hyperdense vessel. Skull: Normal. Negative for fracture or focal lesion. Sinuses/Orbits: No acute finding. Slowly enlarging soft tissue mass or polyp in the left nasopharynx, now measuring 2.1 x 1.3 cm, previously 1.6 x 0.9 cm. Other: None. IMPRESSION: 1.  No acute intracranial abnormality. 2. Slowly enlarging 2.1 x 1.3 cm soft tissue mass or polyp in the left nasopharynx. Correlate with direct visualization. Electronically Signed   By: Titus Dubin M.D.   On: 05/15/2019 15:57   Dg Chest Port 1 View  Result Date: 05/15/2019 CLINICAL DATA:  Altered mental status EXAM: PORTABLE CHEST 1 VIEW COMPARISON:  05/07/2017 FINDINGS: The heart size and mediastinal contours are within normal limits. Both lungs are clear. Apically scarring calcification left apex  unchanged. The visualized skeletal structures are unremarkable. IMPRESSION: No active disease. Electronically Signed   By: Franchot Gallo M.D.   On: 05/15/2019 15:23    Pending Labs [employer] (From admission, onward)    Start     Ordered  05/15/19 1709  SARS Coronavirus 2 (CEPHEID - Performed in [facility] lab), Hosp Order  (Asymptomatic Patients Labs)  Once,   R    Question:  Rule Out  Answer:  Yes   05/15/19 1708   Signed and Held  Basic metabolic panel  Tomorrow morning,   R     Signed and Held   Signed and Held  CBC  Tomorrow morning,   R     Signed and Held          Vitals/Pain Today's Vitals   05/15/19 1430 05/15/19 1530 05/15/19 1630 05/15/19 1800  BP: 101/72 114/66 128/86 118/80  Pulse:  80 80 80  Resp:  18 18 18   Temp:      TempSrc:      SpO2:  94% 94% 95%    Isolation Precautions No active isolations  Medications Medications  cefTRIAXone (ROCEPHIN) 1 g in sodium chloride 0.9 % 100 mL IVPB (has no administration in time range)  sodium chloride 0.9 % bolus 1,000 mL (0 mLs Intravenous Stopped 05/15/19 1600)    Mobility non-ambulatory Moderate fall risk   Focused Assessments Renal Assessment Handoff:           R Recommendations: See Admitting Provider Note  Report given to:   Additional Notes:

## 2019-05-15 NOTE — ED Provider Notes (Signed)
[facility] DEPT Provider Note   CSN: [id number] Arrival date & time: 05/15/19  1323    History   Chief Complaint Chief Complaint  Patient presents with  . Urinary Tract Infection    HPI [name] is a 85 y.o. female.     85 yo F with a chief complaints of altered mental status.  This was noted by the nursing home.  Stated that she is less responsive over the past couple days.  No infectious symptoms were endorsed.  Patient is very alert on her arrival here.  She is confused this area.  Denies complaints.  Level 5 caveat dementia.  The patient had been diagnosed with a presumptive urinary tract infection about 3 days ago.  The history is provided by the patient and the nursing home.  Urinary Tract Infection  Associated symptoms: no fever, no nausea and no vomiting   Illness  Severity:  Mild Onset quality:  Gradual Duration:  2 days Timing:  Constant Progression:  Unchanged Chronicity:  New Associated symptoms: no chest pain, no congestion, no fever, no headaches, no myalgias, no nausea, no rhinorrhea, no shortness of breath, no vomiting and no wheezing     Past Medical History:  Diagnosis Date  . Anxiety   . Arthritis   . Colon cancer ([place])   . Dementia ([place])   . Fibromyalgia   . GERD (gastroesophageal reflux disease)   . Hypertension   . Ovarian cancer [facility])     Patient Active Problem List   Diagnosis Date Noted  . DNR (do not resuscitate) 01/25/2015  . Weakness generalized 01/25/2015  . Palliative care encounter 01/25/2015  . UTI (urinary tract infection) 01/24/2015  . Hypoxia 01/23/2015  . Elevated troponin 01/23/2015  . CKD (chronic kidney disease) stage 3, GFR 30-59 ml/min (HCC) 01/23/2015  . Chronic anemia 01/23/2015  . SOB (shortness of breath) 01/23/2015  . Cough     Past Surgical History:  Procedure Laterality Date  . ABDOMINAL HYSTERECTOMY    . COLON SURGERY       OB History   No obstetric history on  file.      Home Medications    Prior to Admission medications   Medication Sig Start Date End Date Taking? Authorizing Provider  ALPRAZolam Duanne Moron) 0.25 MG tablet Take 2 tablets (0.5 mg total) by mouth every morning. For nerves Patient taking differently: Take 0.0125 mg by mouth every morning. For nerves 01/26/15   Nat Math, MD  cephALEXin (KEFLEX) 500 MG capsule Take 1 capsule (500 mg total) by mouth 4 (four) times daily. Patient not taking: Reported on 05/07/2017 11/20/15   Domenic Moras, PA-C  loratadine (CLARITIN) 10 MG tablet Take 10 mg by mouth daily.    [provider]  mirtazapine (REMERON) 7.5 MG tablet Take 7.5 mg by mouth at bedtime.    [provider]  ondansetron (ZOFRAN) 8 MG tablet Take 8 mg by mouth 3 (three) times daily as needed for nausea or vomiting.     [provider]  oxyCODONE-acetaminophen (PERCOCET/ROXICET) 5-325 MG tablet Take 1 tablet by mouth 3 (three) times daily. 11/20/15   Domenic Moras, PA-C  OXYGEN Inhale 2 L into the lungs continuous.    [provider]  ranitidine (ZANTAC) 150 MG tablet Take 150 mg by mouth 2 (two) times daily.    [provider]  senna-docusate (SENOKOT-S) 8.6-50 MG per tablet Take 1 tablet by mouth daily. 01/26/15   Nat Math, MD  sertraline (  ZOLOFT) 25 MG tablet Take 75 mg by mouth at bedtime.    [provider]  Skin Protectants, Misc. (EUCERIN) cream Apply 1 application topically 2 (two) times daily. Applies to lower legs    [provider]    Family History History reviewed. No pertinent family history.  Social History Social History   Tobacco Use  . Smoking status: Never Smoker  . Smokeless tobacco: Never Used  Substance Use Topics  . Alcohol use: Yes  . Drug use: No     Allergies   Patient has no known allergies.   Review of Systems Review of Systems  Unable to perform ROS: Dementia  Constitutional: Positive for activity change. Negative for  chills and fever.  HENT: Negative for congestion and rhinorrhea.   Eyes: Negative for redness and visual disturbance.  Respiratory: Negative for shortness of breath and wheezing.   Cardiovascular: Negative for chest pain and palpitations.  Gastrointestinal: Negative for nausea and vomiting.  Genitourinary: Negative for dysuria and urgency.  Musculoskeletal: Negative for arthralgias and myalgias.  Skin: Negative for pallor and wound.  Neurological: Negative for dizziness and headaches.     Physical Exam Updated Vital Signs BP 114/66   Pulse 80   Temp 97.6 F (36.4 C) (Oral)   Resp 18   SpO2 94%   Physical Exam Vitals signs and nursing note reviewed.  Constitutional:      General: She is not in acute distress.    Appearance: She is well-developed. She is not diaphoretic.  HENT:     Head: Normocephalic and atraumatic.  Eyes:     Pupils: Pupils are equal, round, and reactive to light.  Neck:     Musculoskeletal: Normal range of motion and neck supple.  Cardiovascular:     Rate and Rhythm: Normal rate and regular rhythm.     Heart sounds: No murmur. No friction rub. No gallop.   Pulmonary:     Effort: Pulmonary effort is normal.     Breath sounds: No wheezing or rales.  Abdominal:     General: There is no distension.     Palpations: Abdomen is soft.     Tenderness: There is no abdominal tenderness.  Musculoskeletal:        General: No tenderness.  Skin:    General: Skin is warm and dry.  Neurological:     Mental Status: She is alert.     Comments: Patient is alert and conversant.  Psychiatric:        Behavior: Behavior normal.      ED Treatments / Results  Labs (all labs ordered are listed, but only abnormal results are displayed) Labs Reviewed  COMPREHENSIVE METABOLIC PANEL - Abnormal; Notable for the following components:      Result Value   Potassium 3.4 (*)    Glucose, Bld 112 (*)    BUN 44 (*)    Creatinine, Ser 1.45 (*)    Calcium 10.9 (*)    GFR  calc non Af Amer 30 (*)    GFR calc Af Amer 34 (*)    All other components within normal limits  CBC WITH DIFFERENTIAL/PLATELET  URINALYSIS, ROUTINE W REFLEX MICROSCOPIC    EKG None  Radiology Ct Head Wo Contrast  Result Date: 05/15/2019 CLINICAL DATA:  Altered mental status.  Recently diagnosed with UTI. EXAM: CT HEAD WITHOUT CONTRAST TECHNIQUE: Contiguous axial images were obtained from the base of the skull through the vertex without intravenous contrast. COMPARISON:  CT head dated May 07, 2017. FINDINGS: Brain: No evidence of acute infarction, hemorrhage, hydrocephalus, extra-axial collection or mass lesion/mass effect. Stable atrophy and mild chronic microvascular ischemic changes. Vascular: Calcified atherosclerosis at the skullbase. No hyperdense vessel. Skull: Normal. Negative for fracture or focal lesion. Sinuses/Orbits: No acute finding. Slowly enlarging soft tissue mass or polyp in the left nasopharynx, now measuring 2.1 x 1.3 cm, previously 1.6 x 0.9 cm. Other: None. IMPRESSION: 1.  No acute intracranial abnormality. 2. Slowly enlarging 2.1 x 1.3 cm soft tissue mass or polyp in the left nasopharynx. Correlate with direct visualization. Electronically Signed   By: Titus Dubin M.D.   On: 05/15/2019 15:57   Dg Chest Port 1 View  Result Date: 05/15/2019 CLINICAL DATA:  Altered mental status EXAM: PORTABLE CHEST 1 VIEW COMPARISON:  05/07/2017 FINDINGS: The heart size and mediastinal contours are within normal limits. Both lungs are clear. Apically scarring calcification left apex unchanged. The visualized skeletal structures are unremarkable. IMPRESSION: No active disease. Electronically Signed   By: Franchot Gallo M.D.   On: 05/15/2019 15:23    Procedures Procedures (including critical care time)  Medications Ordered in ED Medications  sodium chloride 0.9 % bolus 1,000 mL (0 mLs Intravenous Stopped 05/15/19 1600)     Initial Impression / Assessment and Plan / ED Course  I have  reviewed the triage vital signs and the nursing notes.  Pertinent labs & imaging results that were available during my care of the patient were reviewed by me and considered in my medical decision making (see chart for details).        85 yO F with a chief complaint of a change in mental status.  Going on for the past couple days.  Will screen for infection chest x-ray UA.  Will obtain a CT of the head.  CT of the head without intracranial pathology.  Chest x-ray viewed by me without focal infiltrate.  Lab work without leukocytosis.  She has a mild acute kidney injury.  She was given a liter of IV fluids.  She was very alert on arrival here, I wonder if she was mildly dehydrated and improved with IV fluids with EMS.  Awaiting UA.  I feel the patient is likely able to be discharged either way.  Patient care signed out to Dr. Francia Greaves.   The patients results and plan were reviewed and discussed.   Any x-rays performed were independently reviewed by myself.   Differential diagnosis were considered with the presenting HPI.  Medications  sodium chloride 0.9 % bolus 1,000 mL (0 mLs Intravenous Stopped 05/15/19 1600)    Vitals:   05/15/19 1400 05/15/19 1405 05/15/19 1430 05/15/19 1530  BP: 120/82 (!) 146/124 101/72 114/66  Pulse: 100 (!) 122  80  Resp: 20 20  18   Temp:      TempSrc:      SpO2: 94% 96%  94%    Final diagnoses:  Transient alteration of awareness    Admission/ observation were discussed with the admitting physician, patient and/or family and they are comfortable with the plan.   Final Clinical Impressions(s) / ED Diagnoses   Final diagnoses:  Transient alteration of awareness    ED Discharge Orders    None       Deno Etienne, DO 05/15/19 1613

## 2019-05-15 NOTE — ED Triage Notes (Signed)
Pt BIB EMS from [facility]. Pt diagnosed with UTI 3 days ago. Pt has decreased oral intake, slower to respond, more lethargic, unable to take med per facility. Pt on 2L O2 at facility. Pt has hx of dementia. Pt alert and verbal.   CBG 124 Temp 97.9 BP 138/64 HR 86 95% 2L

## 2019-05-15 NOTE — Progress Notes (Signed)
Received call from Ms. [name] patient's daughter updated plan about admission and treatment for UTI and dehydration and probable discharge back to nursing home tomorrow.

## 2019-05-15 NOTE — ED Notes (Signed)
Patient transported to CT 

## 2019-05-16 DIAGNOSIS — E860 Dehydration: Secondary | ICD-10-CM | POA: Diagnosis not present

## 2019-05-16 DIAGNOSIS — N3000 Acute cystitis without hematuria: Secondary | ICD-10-CM | POA: Diagnosis not present

## 2019-05-16 DIAGNOSIS — N179 Acute kidney failure, unspecified: Secondary | ICD-10-CM | POA: Diagnosis not present

## 2019-05-16 DIAGNOSIS — L89000 Pressure ulcer of unspecified elbow, unstageable: Secondary | ICD-10-CM | POA: Diagnosis not present

## 2019-05-16 DIAGNOSIS — L8990 Pressure ulcer of unspecified site, unspecified stage: Secondary | ICD-10-CM

## 2019-05-16 LAB — CBC
HCT: 36.6 % (ref 36.0–46.0)
Hemoglobin: 11.4 g/dL — ABNORMAL LOW (ref 12.0–15.0)
MCH: 29.9 pg (ref 26.0–34.0)
MCHC: 31.1 g/dL (ref 30.0–36.0)
MCV: 96.1 fL (ref 80.0–100.0)
Platelets: 143 10*3/uL — ABNORMAL LOW (ref 150–400)
RBC: 3.81 MIL/uL — ABNORMAL LOW (ref 3.87–5.11)
RDW: 14.7 % (ref 11.5–15.5)
WBC: 5.6 10*3/uL (ref 4.0–10.5)
nRBC: 0 % (ref 0.0–0.2)

## 2019-05-16 LAB — BASIC METABOLIC PANEL
Anion gap: 8 (ref 5–15)
BUN: 31 mg/dL — ABNORMAL HIGH (ref 8–23)
CO2: 23 mmol/L (ref 22–32)
Calcium: 10.5 mg/dL — ABNORMAL HIGH (ref 8.9–10.3)
Chloride: 110 mmol/L (ref 98–111)
Creatinine, Ser: 1.11 mg/dL — ABNORMAL HIGH (ref 0.44–1.00)
GFR calc Af Amer: 48 mL/min — ABNORMAL LOW (ref >60)
GFR calc non Af Amer: 41 mL/min — ABNORMAL LOW (ref >60)
Glucose, Bld: 79 mg/dL (ref 70–99)
Potassium: 3.4 mmol/L — ABNORMAL LOW (ref 3.5–5.1)
Sodium: 141 mmol/L (ref 135–145)

## 2019-05-16 MED ORDER — SODIUM CHLORIDE 0.9 % IV SOLN
1.0000 g | Freq: Once | INTRAVENOUS | Status: AC
  Administered 2019-05-16: 1 g via INTRAVENOUS
  Filled 2019-05-16: qty 1

## 2019-05-16 MED ORDER — CEPHALEXIN 250 MG PO CAPS: 250.0000 mg | ORAL_CAPSULE | Freq: Three times a day (TID) | ORAL | 0 refills | Status: DC

## 2019-05-16 NOTE — Care Management Obs Status (Signed)
[place] NOTIFICATION   Patient Details  Name: [name] MRN: [id number] Date of Birth: [birth year]   Medicare Observation Status Notification Given:  Yes    Erenest Rasher, RN 05/16/2019, 6:12 PM

## 2019-05-16 NOTE — Progress Notes (Signed)
Pt satting 100% on room air via finger. Discharge information sent with pt to facility. Discharged to [facility] via [place].

## 2019-05-16 NOTE — Progress Notes (Signed)
[facility] arrived to pick up pt, noted she was satting 83% on 3L O2 on fingers. This RN previously had obtained 90% on 3L. Pt denies SOB. MD notified. Discharge cancelled. Able to obtain pulse ox on toe, 100% on 3L.

## 2019-05-16 NOTE — Discharge Summary (Signed)
Physician Discharge Summary  [name] KAJ:[id number] DOB: 1918/06/29 DOA: 05/15/2019  PCP: Wenda Low, MD  Admit date: 05/15/2019 Discharge date: 05/16/2019  Admitted From: Long-term nursing home Disposition: Back to nursing home  Recommendations for Outpatient Follow-up:  1. Follow-up at nursing home with provider.  Home Health: Not applicable Equipment/Devices: Not applicable  Discharge Condition: Fair CODE STATUS: DNR/DNI Diet recommendation: Regular diet  Brief/Interim Summary: [name] is a 85 y.o. female with medical history significant of dementia, arthritis, chronic hypoxic respiratory failure on 2 L of oxygen and chronic debility, GERD who was brought to the emergency room from long-term nursing home with lethargic and recently treated UTI.  Patient is poor historian due to advanced dementia.  History was taken from nursing home records as well as emergency room interaction with EMS.  According to the records, she was not feeling quite well for the last 3 to 4 days.  She was diagnosed with UTI and started on Keflex at the nursing home 3 days ago.  Patient was noted to be more lethargic and not eating well and also noted to be somehow confused that prompted them to transfer her to the ER today.    In the emergency room, she was found with abnormal urine and slightly abnormal renal functions.  Discharge Diagnoses:  Principal Problem:   UTI (urinary tract infection) Active Problems:   CKD (chronic kidney disease) stage 3, GFR 30-59 ml/min (HCC)   Chronic anemia   Weakness generalized   AKI (acute kidney injury) ([place])   Pressure injury of skin   1.  Acute UTI present on admission: Failed outpatient therapy.  Was kept in the hospital for observation. Received Rocephin 1 g IV on 05/15/2019 and 05/16/2019. Urine cultures are pending. Patient is fairly stable. We will discharge with 5 more days of oral Keflex to finish therapy. We will call if any change in urine  cultures.  2.  Acute kidney injury with moderate dehydration with underlying chronic kidney disease stage III:  Treated with IV fluids with improvement.    3.  Chronic anemia: Stable.  4.  Chronic debility/dementia: On supportive treatment.  From long-term nursing home.  5.  Multiple skin changes and pressure ulcers present on admission: Patient does have multiple skin changes and pressure ulcers as described below they were present on admission.  She is appropriately on precautions and barrier dressing.  Please continue.  Patient is fairly stable.  We will discharge her back to long-term nursing home today.  Discharge Instructions  Discharge Instructions    Diet - low sodium heart healthy   Complete by:  As directed    Encourage oral liquids   Increase activity slowly   Complete by:  As directed      Allergies as of 05/16/2019   No Known Allergies     Medication List    TAKE these medications   cephALEXin 250 MG capsule Commonly known as:  KEFLEX Take 1 capsule (250 mg total) by mouth 3 (three) times daily for 5 days.   eucerin cream Apply 1 application topically 2 (two) times daily. Applies to lower legs   famotidine 20 MG tablet Commonly known as:  PEPCID Take 20 mg by mouth at bedtime.   folic acid 1 MG tablet Commonly known as:  FOLVITE Take 1 mg by mouth daily.   loratadine 10 MG tablet Commonly known as:  CLARITIN Take 10 mg by mouth daily.   mirtazapine 7.5 MG tablet Commonly known as:  REMERON Take 7.5 mg by mouth at bedtime.   MULTIVITAMIN ADULT PO Take 1 capsule by mouth daily.   ondansetron 8 MG tablet Commonly known as:  ZOFRAN Take 8 mg by mouth 3 (three) times daily as needed for nausea or vomiting.   oxyCODONE-acetaminophen 5-325 MG tablet Commonly known as:  PERCOCET/ROXICET Take 1 tablet by mouth 3 (three) times daily.   OXYGEN Inhale 2 L into the lungs continuous.   Probiotic Acidophilus Caps Take 1 capsule by mouth 2 (two)  times a day.   protein supplement Powd Take 1 Scoop by mouth 2 (two) times a day.   ranitidine 150 MG tablet Commonly known as:  ZANTAC Take 150 mg by mouth 2 (two) times daily.   senna 8.6 MG Tabs tablet Commonly known as:  SENOKOT Take 1 tablet by mouth daily.   senna-docusate 8.6-50 MG tablet Commonly known as:  Senokot-S Take 1 tablet by mouth daily.   sertraline 25 MG tablet Commonly known as:  ZOLOFT Take 75 mg by mouth at bedtime.       No Known Allergies  Consultations:  None.   Procedures/Studies: Ct Head Wo Contrast  Result Date: 05/15/2019 CLINICAL DATA:  Altered mental status.  Recently diagnosed with UTI. EXAM: CT HEAD WITHOUT CONTRAST TECHNIQUE: Contiguous axial images were obtained from the base of the skull through the vertex without intravenous contrast. COMPARISON:  CT head dated May 07, 2017. FINDINGS: Brain: No evidence of acute infarction, hemorrhage, hydrocephalus, extra-axial collection or mass lesion/mass effect. Stable atrophy and mild chronic microvascular ischemic changes. Vascular: Calcified atherosclerosis at the skullbase. No hyperdense vessel. Skull: Normal. Negative for fracture or focal lesion. Sinuses/Orbits: No acute finding. Slowly enlarging soft tissue mass or polyp in the left nasopharynx, now measuring 2.1 x 1.3 cm, previously 1.6 x 0.9 cm. Other: None. IMPRESSION: 1.  No acute intracranial abnormality. 2. Slowly enlarging 2.1 x 1.3 cm soft tissue mass or polyp in the left nasopharynx. Correlate with direct visualization. Electronically Signed   By: Titus Dubin M.D.   On: 05/15/2019 15:57   Dg Chest Port 1 View  Result Date: 05/15/2019 CLINICAL DATA:  Altered mental status EXAM: PORTABLE CHEST 1 VIEW COMPARISON:  05/07/2017 FINDINGS: The heart size and mediastinal contours are within normal limits. Both lungs are clear. Apically scarring calcification left apex unchanged. The visualized skeletal structures are unremarkable. IMPRESSION:  No active disease. Electronically Signed   By: Franchot Gallo M.D.   On: 05/15/2019 15:23      Subjective: Patient seen and examined.  No overnight events.  Poor historian.  Denies any complaints.  Remains afebrile overnight.   Discharge Exam: Vitals:   05/15/19 2139 05/16/19 0501  BP: 135/69 117/82  Pulse: 74 70  Resp: 14 16  Temp: 97.6 F (36.4 C) 97.6 F (36.4 C)  SpO2: 100% 97%   Vitals:   05/15/19 1800 05/15/19 1914 05/15/19 2139 05/16/19 0501  BP: 118/80 (!) 122/91 135/69 117/82  Pulse: 80 (!) 110 74 70  Resp: 18 19 14 16   Temp:  (!) 97.1 F (36.2 C) 97.6 F (36.4 C) 97.6 F (36.4 C)  TempSrc:  Oral    SpO2: 95% 92% 100% 97%    General: Pt is alert, awake, not in acute distress, patient is appropriate for her age and debility. Cardiovascular: RRR, S1/S2 +, no rubs, no gallops Respiratory: CTA bilaterally, no wheezing, no rhonchi Abdominal: Soft, NT, ND, bowel sounds + Extremities: no edema, no cyanosis Wound care noted as from  complete nursing assessment; B/L heels: intact, but boggy; pink foam heel protectors placed, feet elevated on pillow. Left foot 2nd toe- callous/blister- OTA. Right foot, 2nd/3rd toes: blackened wounds resembling callouses or hardened blisters; OTA, no drainage noted. B/L LE's: discolored, multiple bruises that go circumferentially in various stages of healing; ranging from black to pale yellow. B/L knees: +1 pitting edema, R>L, OTA. Left buttock near gluteal fold: small skin tear or friction rub approximately the size of a nickel, pink, no drainage; covered with pink foam. Right buttock, near gluteal fold: reddened area, skin unbroken, stage I-covered with pink foam. Mid back, along spine (bony prominence):pressure ulcer, reddened, unblanching, stage I. Covered with pink foam. B/L UE's: scattered bruises, varying stages of healing, OTA. Skin overall is dry and flaky, more flakiness noted to toes/feet, and scattered on arms. No noted skin issues to  anterior trunk.    The results of significant diagnostics from this hospitalization (including imaging, microbiology, ancillary and laboratory) are listed below for reference.     Microbiology: Recent Results (from the past 240 hour(s))  SARS Coronavirus 2 (CEPHEID - Performed in [facility] [place] hospital lab), Hosp Order     Status: None   Collection Time: 05/15/19  5:24 PM  Result Value Ref Range Status   SARS Coronavirus 2 NEGATIVE NEGATIVE Final    Comment: (NOTE) If result is NEGATIVE SARS-CoV-2 target nucleic acids are NOT DETECTED. The SARS-CoV-2 RNA is generally detectable in upper and lower  respiratory specimens during the acute phase of infection. The lowest  concentration of SARS-CoV-2 viral copies this assay can detect is 250  copies / mL. A negative result does not preclude SARS-CoV-2 infection  and should not be used as the sole basis for treatment or other  patient management decisions.  A negative result may occur with  improper specimen collection / handling, submission of specimen other  than nasopharyngeal swab, presence of viral mutation(s) within the  areas targeted by this assay, and inadequate number of viral copies  (<250 copies / mL). A negative result must be combined with clinical  observations, patient history, and epidemiological information. If result is POSITIVE SARS-CoV-2 target nucleic acids are DETECTED. The SARS-CoV-2 RNA is generally detectable in upper and lower  respiratory specimens dur ing the acute phase of infection.  Positive  results are indicative of active infection with SARS-CoV-2.  Clinical  correlation with patient history and other diagnostic information is  necessary to determine patient infection status.  Positive results do  not rule out bacterial infection or co-infection with other viruses. If result is PRESUMPTIVE POSTIVE SARS-CoV-2 nucleic acids MAY BE PRESENT.   A presumptive positive result was obtained on the submitted  specimen  and confirmed on repeat testing.  While 2019 novel coronavirus  (SARS-CoV-2) nucleic acids may be present in the submitted sample  additional confirmatory testing may be necessary for epidemiological  and / or clinical management purposes  to differentiate between  SARS-CoV-2 and other Sarbecovirus currently known to infect humans.  If clinically indicated additional testing with an alternate test  methodology [phone]) is advised. The SARS-CoV-2 RNA is generally  detectable in upper and lower respiratory sp ecimens during the acute  phase of infection. The expected result is Negative. Fact Sheet for Patients:  StrictlyIdeas.no Fact Sheet for Healthcare Providers: BankingDealers.co.za This test is not yet approved or cleared by the [place] FDA and has been authorized for detection and/or diagnosis of SARS-CoV-2 by FDA under an Emergency Use Authorization (EUA).  This  EUA will remain in effect (meaning this test can be used) for the duration of the COVID-19 declaration under Section 564(b)(1) of the Act, 21 U.S.C. section 360bbb-3(b)(1), unless the authorization is terminated or revoked sooner. Performed at [facility], [place] [address] [place], [place] [address]      Labs: BNP (last 3 results) No results for input(s): BNP in the last 8760 hours. Basic Metabolic Panel: Recent Labs  Lab 05/15/19 1351 05/16/19 0510  NA 139 141  K 3.4* 3.4*  CL 106 110  CO2 24 23  GLUCOSE 112* 79  BUN 44* 31*  CREATININE 1.45* 1.11*  CALCIUM 10.9* 10.5*   Liver Function Tests: Recent Labs  Lab 05/15/19 1351  AST 19  ALT 9  ALKPHOS 72  BILITOT 0.8  PROT 7.5  ALBUMIN 3.9   No results for input(s): LIPASE, AMYLASE in the last 168 hours. No results for input(s): AMMONIA in the last 168 hours. CBC: Recent Labs  Lab 05/15/19 1351 05/16/19 0510  WBC 8.0 5.6  NEUTROABS 6.0  --   HGB 13.8 11.4*  HCT  42.9 36.6  MCV 93.5 96.1  PLT 206 143*   Cardiac Enzymes: No results for input(s): CKTOTAL, CKMB, CKMBINDEX, TROPONINI in the last 168 hours. BNP: Invalid input(s): POCBNP CBG: No results for input(s): GLUCAP in the last 168 hours. D-Dimer No results for input(s): DDIMER in the last 72 hours. Hgb A1c No results for input(s): HGBA1C in the last 72 hours. Lipid Profile No results for input(s): CHOL, HDL, LDLCALC, TRIG, CHOLHDL, LDLDIRECT in the last 72 hours. Thyroid function studies No results for input(s): TSH, T4TOTAL, T3FREE, THYROIDAB in the last 72 hours.  Invalid input(s): FREET3 Anemia work up No results for input(s): VITAMINB12, FOLATE, FERRITIN, TIBC, IRON, RETICCTPCT in the last 72 hours. Urinalysis    Component Value Date/Time   COLORURINE YELLOW 05/15/2019 1404   APPEARANCEUR TURBID (A) 05/15/2019 1404   LABSPEC 1.016 05/15/2019 1404   PHURINE 6.0 05/15/2019 1404   GLUCOSEU NEGATIVE 05/15/2019 1404   HGBUR LARGE (A) 05/15/2019 1404   BILIRUBINUR NEGATIVE 05/15/2019 1404   KETONESUR 20 (A) 05/15/2019 1404   PROTEINUR 100 (A) 05/15/2019 1404   UROBILINOGEN 1.0 01/23/2015 1945   NITRITE NEGATIVE 05/15/2019 1404   LEUKOCYTESUR MODERATE (A) 05/15/2019 1404   Sepsis Labs Invalid input(s): PROCALCITONIN,  WBC,  LACTICIDVEN Microbiology Recent Results (from the past 240 hour(s))  SARS Coronavirus 2 (CEPHEID - Performed in [place] hospital lab), Hosp Order     Status: None   Collection Time: 05/15/19  5:24 PM  Result Value Ref Range Status   SARS Coronavirus 2 NEGATIVE NEGATIVE Final    Comment: (NOTE) If result is NEGATIVE SARS-CoV-2 target nucleic acids are NOT DETECTED. The SARS-CoV-2 RNA is generally detectable in upper and lower  respiratory specimens during the acute phase of infection. The lowest  concentration of SARS-CoV-2 viral copies this assay can detect is 250  copies / mL. A negative result does not preclude SARS-CoV-2 infection  and should not  be used as the sole basis for treatment or other  patient management decisions.  A negative result may occur with  improper specimen collection / handling, submission of specimen other  than nasopharyngeal swab, presence of viral mutation(s) within the  areas targeted by this assay, and inadequate number of viral copies  (<250 copies / mL). A negative result must be combined with clinical  observations, patient history, and epidemiological information. If result is POSITIVE SARS-CoV-2 target  nucleic acids are DETECTED. The SARS-CoV-2 RNA is generally detectable in upper and lower  respiratory specimens dur ing the acute phase of infection.  Positive  results are indicative of active infection with SARS-CoV-2.  Clinical  correlation with patient history and other diagnostic information is  necessary to determine patient infection status.  Positive results do  not rule out bacterial infection or co-infection with other viruses. If result is PRESUMPTIVE POSTIVE SARS-CoV-2 nucleic acids MAY BE PRESENT.   A presumptive positive result was obtained on the submitted specimen  and confirmed on repeat testing.  While 2019 novel coronavirus  (SARS-CoV-2) nucleic acids may be present in the submitted sample  additional confirmatory testing may be necessary for epidemiological  and / or clinical management purposes  to differentiate between  SARS-CoV-2 and other Sarbecovirus currently known to infect humans.  If clinically indicated additional testing with an alternate test  methodology [phone]) is advised. The SARS-CoV-2 RNA is generally  detectable in upper and lower respiratory sp ecimens during the acute  phase of infection. The expected result is Negative. Fact Sheet for Patients:  StrictlyIdeas.no Fact Sheet for Healthcare Providers: BankingDealers.co.za This test is not yet approved or cleared by the [place] FDA and has been authorized  for detection and/or diagnosis of SARS-CoV-2 by FDA under an Emergency Use Authorization (EUA).  This EUA will remain in effect (meaning this test can be used) for the duration of the COVID-19 declaration under Section 564(b)(1) of the Act, 21 U.S.C. section 360bbb-3(b)(1), unless the authorization is terminated or revoked sooner. Performed at [facility], [place] [address], [place], [place] [address]      Time coordinating discharge:  30 minutes  SIGNED:   Barb Merino, MD  [facility] 05/16/2019, 11:31 AM Pager [phone]  If 7PM-7AM, please contact night-coverage [email or web address] Password [email or web address]

## 2019-05-16 NOTE — Progress Notes (Signed)
This nurse attempted to contact [facility] multiple times with no answer to update on pt condition. Pt now satting 100% on room air. [facility] contacted for transport.

## 2019-05-16 NOTE — Progress Notes (Addendum)
Pt resident of [facility] assisted living, plan to return today. RN spoke with facility who is verifying with administration their [id number] testing policy for returning residents.  CSW spoke with pt's daughter [name] [phone] who is aware of pt's status and asks to be updated if any covid19 testing protocol delays pt's expected DC today. Thanks hospital staff for care of pt. Will assist with arranging transportation back to [facility]. Provided DC summary via the HUB.  Sharren Bridge, LCSW, Transitions of Care Weekend coverage [phone]   15:10- left voicemail for [facility] to follow up on pt being able to return to facility. Requested call back to [facility] team or unit RN re [phone] test requirements  16:00 Spoke with Kalman Shan at [facility] - she advised pt can return and will be required to wear mask and stay in room x14 days after returning from the hospital. CSW updated daughter via phone. Will arrange transportation.

## 2019-05-18 ENCOUNTER — Inpatient Hospital Stay (HOSPITAL_COMMUNITY)
Admission: EM | Admit: 2019-05-18 | Discharge: 2019-05-21 | DRG: 640 | Disposition: A | Discharge status: Hospice | Payer: Medicare Other | Source: Skilled Nursing Facility | Attending: Internal Medicine | Admitting: Internal Medicine

## 2019-05-18 ENCOUNTER — Other Ambulatory Visit: Payer: Self-pay

## 2019-05-18 ENCOUNTER — Emergency Department (HOSPITAL_COMMUNITY): Payer: Medicare Other

## 2019-05-18 DIAGNOSIS — Z1159 Encounter for screening for other viral diseases: Secondary | ICD-10-CM

## 2019-05-18 DIAGNOSIS — R6251 Failure to thrive (child): Secondary | ICD-10-CM | POA: Diagnosis present

## 2019-05-18 DIAGNOSIS — Z85038 Personal history of other malignant neoplasm of large intestine: Secondary | ICD-10-CM

## 2019-05-18 DIAGNOSIS — I447 Left bundle-branch block, unspecified: Secondary | ICD-10-CM | POA: Diagnosis present

## 2019-05-18 DIAGNOSIS — E162 Hypoglycemia, unspecified: Secondary | ICD-10-CM | POA: Diagnosis not present

## 2019-05-18 DIAGNOSIS — G9341 Metabolic encephalopathy: Secondary | ICD-10-CM | POA: Diagnosis present

## 2019-05-18 DIAGNOSIS — M797 Fibromyalgia: Secondary | ICD-10-CM | POA: Diagnosis present

## 2019-05-18 DIAGNOSIS — F0280 Dementia in other diseases classified elsewhere without behavioral disturbance: Secondary | ICD-10-CM

## 2019-05-18 DIAGNOSIS — N390 Urinary tract infection, site not specified: Secondary | ICD-10-CM | POA: Diagnosis present

## 2019-05-18 DIAGNOSIS — G301 Alzheimer's disease with late onset: Secondary | ICD-10-CM

## 2019-05-18 DIAGNOSIS — N183 Chronic kidney disease, stage 3 unspecified: Secondary | ICD-10-CM | POA: Diagnosis present

## 2019-05-18 DIAGNOSIS — M25559 Pain in unspecified hip: Secondary | ICD-10-CM | POA: Diagnosis present

## 2019-05-18 DIAGNOSIS — Z9981 Dependence on supplemental oxygen: Secondary | ICD-10-CM

## 2019-05-18 DIAGNOSIS — D649 Anemia, unspecified: Secondary | ICD-10-CM | POA: Diagnosis present

## 2019-05-18 DIAGNOSIS — E876 Hypokalemia: Secondary | ICD-10-CM | POA: Diagnosis present

## 2019-05-18 DIAGNOSIS — F419 Anxiety disorder, unspecified: Secondary | ICD-10-CM | POA: Diagnosis present

## 2019-05-18 DIAGNOSIS — Z8543 Personal history of malignant neoplasm of ovary: Secondary | ICD-10-CM

## 2019-05-18 DIAGNOSIS — F0390 Unspecified dementia without behavioral disturbance: Secondary | ICD-10-CM | POA: Diagnosis present

## 2019-05-18 DIAGNOSIS — J339 Nasal polyp, unspecified: Secondary | ICD-10-CM | POA: Diagnosis present

## 2019-05-18 DIAGNOSIS — M1990 Unspecified osteoarthritis, unspecified site: Secondary | ICD-10-CM | POA: Diagnosis present

## 2019-05-18 DIAGNOSIS — Z23 Encounter for immunization: Secondary | ICD-10-CM

## 2019-05-18 DIAGNOSIS — Z79899 Other long term (current) drug therapy: Secondary | ICD-10-CM

## 2019-05-18 DIAGNOSIS — I4891 Unspecified atrial fibrillation: Secondary | ICD-10-CM | POA: Diagnosis present

## 2019-05-18 DIAGNOSIS — Z993 Dependence on wheelchair: Secondary | ICD-10-CM

## 2019-05-18 DIAGNOSIS — D631 Anemia in chronic kidney disease: Secondary | ICD-10-CM | POA: Diagnosis present

## 2019-05-18 DIAGNOSIS — Z66 Do not resuscitate: Secondary | ICD-10-CM | POA: Diagnosis present

## 2019-05-18 DIAGNOSIS — I129 Hypertensive chronic kidney disease with stage 1 through stage 4 chronic kidney disease, or unspecified chronic kidney disease: Secondary | ICD-10-CM | POA: Diagnosis present

## 2019-05-18 DIAGNOSIS — Z87440 Personal history of urinary (tract) infections: Secondary | ICD-10-CM

## 2019-05-18 DIAGNOSIS — Z90710 Acquired absence of both cervix and uterus: Secondary | ICD-10-CM

## 2019-05-18 DIAGNOSIS — J9611 Chronic respiratory failure with hypoxia: Secondary | ICD-10-CM | POA: Diagnosis present

## 2019-05-18 DIAGNOSIS — E860 Dehydration: Secondary | ICD-10-CM | POA: Diagnosis not present

## 2019-05-18 DIAGNOSIS — L8990 Pressure ulcer of unspecified site, unspecified stage: Secondary | ICD-10-CM | POA: Diagnosis present

## 2019-05-18 DIAGNOSIS — Z79891 Long term (current) use of opiate analgesic: Secondary | ICD-10-CM

## 2019-05-18 DIAGNOSIS — K219 Gastro-esophageal reflux disease without esophagitis: Secondary | ICD-10-CM | POA: Diagnosis present

## 2019-05-18 DIAGNOSIS — R4182 Altered mental status, unspecified: Secondary | ICD-10-CM

## 2019-05-18 DIAGNOSIS — R627 Adult failure to thrive: Secondary | ICD-10-CM | POA: Diagnosis present

## 2019-05-18 DIAGNOSIS — R5381 Other malaise: Secondary | ICD-10-CM | POA: Diagnosis present

## 2019-05-18 LAB — CBC WITH DIFFERENTIAL/PLATELET
Abs Immature Granulocytes: 0.02 10*3/uL (ref 0.00–0.07)
Basophils Absolute: 0 10*3/uL (ref 0.0–0.1)
Basophils Relative: 1 %
Eosinophils Absolute: 0 10*3/uL (ref 0.0–0.5)
Eosinophils Relative: 1 %
HCT: 43.3 % (ref 36.0–46.0)
Hemoglobin: 13.5 g/dL (ref 12.0–15.0)
Immature Granulocytes: 1 %
Lymphocytes Relative: 48 %
Lymphs Abs: 2 10*3/uL (ref 0.7–4.0)
MCH: 29.3 pg (ref 26.0–34.0)
MCHC: 31.2 g/dL (ref 30.0–36.0)
MCV: 93.9 fL (ref 80.0–100.0)
Monocytes Absolute: 0.4 10*3/uL (ref 0.1–1.0)
Monocytes Relative: 10 %
Neutro Abs: 1.6 10*3/uL — ABNORMAL LOW (ref 1.7–7.7)
Neutrophils Relative %: 39 %
Platelets: 132 10*3/uL — ABNORMAL LOW (ref 150–400)
RBC: 4.61 MIL/uL (ref 3.87–5.11)
RDW: 14.9 % (ref 11.5–15.5)
WBC: 4.1 10*3/uL (ref 4.0–10.5)
nRBC: 0 % (ref 0.0–0.2)

## 2019-05-18 LAB — CBG MONITORING, ED
Glucose-Capillary: 58 mg/dL — ABNORMAL LOW (ref 70–99)
Glucose-Capillary: 136 mg/dL — ABNORMAL HIGH (ref 70–99)
Glucose-Capillary: 75 mg/dL (ref 70–99)

## 2019-05-18 LAB — URINE CULTURE: Culture: 50000 — AB

## 2019-05-18 LAB — COMPREHENSIVE METABOLIC PANEL
ALT: 9 U/L (ref 0–44)
AST: 23 U/L (ref 15–41)
Albumin: 3.4 g/dL — ABNORMAL LOW (ref 3.5–5.0)
Alkaline Phosphatase: 68 U/L (ref 38–126)
Anion gap: 13 (ref 5–15)
BUN: 24 mg/dL — ABNORMAL HIGH (ref 8–23)
CO2: 20 mmol/L — ABNORMAL LOW (ref 22–32)
Calcium: 10.4 mg/dL — ABNORMAL HIGH (ref 8.9–10.3)
Chloride: 108 mmol/L (ref 98–111)
Creatinine, Ser: 1.28 mg/dL — ABNORMAL HIGH (ref 0.44–1.00)
GFR calc Af Amer: 40 mL/min — ABNORMAL LOW (ref >60)
GFR calc non Af Amer: 35 mL/min — ABNORMAL LOW (ref >60)
Glucose, Bld: 80 mg/dL (ref 70–99)
Potassium: 4.3 mmol/L (ref 3.5–5.1)
Sodium: 141 mmol/L (ref 135–145)
Total Bilirubin: 0.8 mg/dL (ref 0.3–1.2)
Total Protein: 7.2 g/dL (ref 6.5–8.1)

## 2019-05-18 LAB — URINALYSIS, ROUTINE W REFLEX MICROSCOPIC
Bilirubin Urine: NEGATIVE
Glucose, UA: >=500 mg/dL — AB
Ketones, ur: 5 mg/dL — AB
Nitrite: NEGATIVE
Protein, ur: 30 mg/dL — AB
Specific Gravity, Urine: 1.02 (ref 1.005–1.030)
pH: 6 (ref 5.0–8.0)

## 2019-05-18 LAB — SARS CORONAVIRUS 2 BY RT PCR (HOSPITAL ORDER, PERFORMED IN ~~LOC~~ HOSPITAL LAB): SARS Coronavirus 2: NEGATIVE

## 2019-05-18 LAB — PROTIME-INR
INR: 1.4 — ABNORMAL HIGH (ref 0.8–1.2)
Prothrombin Time: 16.8 seconds — ABNORMAL HIGH (ref 11.4–15.2)

## 2019-05-18 LAB — TROPONIN I: Troponin I: 0.03 ng/mL (ref <0.03)

## 2019-05-18 MED ORDER — ACETAMINOPHEN 650 MG RE SUPP: 650.0000 mg | Freq: Four times a day (QID) | RECTAL | Status: DC | PRN

## 2019-05-18 MED ORDER — ACETAMINOPHEN 325 MG PO TABS: 650.0000 mg | ORAL_TABLET | Freq: Four times a day (QID) | ORAL | Status: DC | PRN

## 2019-05-18 MED ORDER — FAMOTIDINE 20 MG PO TABS
20.0000 mg | ORAL_TABLET | Freq: Every day | ORAL | Status: DC
  Administered 2019-05-19 – 2019-05-20 (×2): 20 mg via ORAL
  Filled 2019-05-18 (×3): qty 1

## 2019-05-18 MED ORDER — SENNA 8.6 MG PO TABS: 1.0000 | ORAL_TABLET | Freq: Every day | ORAL | Status: DC

## 2019-05-18 MED ORDER — ONDANSETRON HCL 4 MG PO TABS: 8.0000 mg | ORAL_TABLET | Freq: Three times a day (TID) | ORAL | Status: DC | PRN

## 2019-05-18 MED ORDER — DEXTROSE-NACL 5-0.9 % IV SOLN
INTRAVENOUS | Status: DC
  Administered 2019-05-18 – 2019-05-19 (×2): via INTRAVENOUS

## 2019-05-18 MED ORDER — BENEPROTEIN PO POWD
1.0000 | Freq: Two times a day (BID) | ORAL | Status: DC
  Administered 2019-05-19 – 2019-05-21 (×4): 6 g via ORAL
  Filled 2019-05-18 (×2): qty 227

## 2019-05-18 MED ORDER — SENNOSIDES-DOCUSATE SODIUM 8.6-50 MG PO TABS
1.0000 | ORAL_TABLET | Freq: Every day | ORAL | Status: DC
  Administered 2019-05-20: 11:00:00 1 via ORAL
  Filled 2019-05-18 (×2): qty 1

## 2019-05-18 MED ORDER — HYDROCERIN EX CREA
1.0000 "application " | TOPICAL_CREAM | Freq: Two times a day (BID) | CUTANEOUS | Status: DC
  Administered 2019-05-18 – 2019-05-21 (×6): 1 via TOPICAL
  Filled 2019-05-18: qty 113

## 2019-05-18 MED ORDER — SODIUM CHLORIDE 0.9 % IV SOLN: INTRAVENOUS | Status: DC | PRN

## 2019-05-18 MED ORDER — SODIUM CHLORIDE 0.9 % IV BOLUS
500.0000 mL | Freq: Once | INTRAVENOUS | Status: AC
  Administered 2019-05-18: 12:00:00 500 mL via INTRAVENOUS

## 2019-05-18 MED ORDER — SODIUM CHLORIDE 0.9 % IV SOLN
1.0000 g | Freq: Once | INTRAVENOUS | Status: AC
  Administered 2019-05-18: 14:00:00 1 g via INTRAVENOUS
  Filled 2019-05-18: qty 10

## 2019-05-18 MED ORDER — DEXTROSE 50 % IV SOLN
1.0000 | Freq: Once | INTRAVENOUS | Status: AC
  Administered 2019-05-18: 50 mL via INTRAVENOUS
  Filled 2019-05-18: qty 50

## 2019-05-18 MED ORDER — RISAQUAD PO CAPS
1.0000 | ORAL_CAPSULE | Freq: Two times a day (BID) | ORAL | Status: DC
  Administered 2019-05-19 – 2019-05-21 (×4): 1 via ORAL
  Filled 2019-05-18 (×6): qty 1

## 2019-05-18 MED ORDER — FOLIC ACID 1 MG PO TABS
1.0000 mg | ORAL_TABLET | Freq: Every day | ORAL | Status: DC
  Administered 2019-05-20 – 2019-05-21 (×2): 1 mg via ORAL
  Filled 2019-05-18 (×2): qty 1

## 2019-05-18 MED ORDER — PROBIOTIC ACIDOPHILUS PO CAPS: 1.0000 | ORAL_CAPSULE | Freq: Two times a day (BID) | ORAL | Status: DC

## 2019-05-18 MED ORDER — SODIUM CHLORIDE 0.9 % IV SOLN: Freq: Once | INTRAVENOUS | Status: DC

## 2019-05-18 MED ORDER — SODIUM CHLORIDE 0.9 % IV SOLN
1.0000 g | INTRAVENOUS | Status: DC
  Administered 2019-05-19 – 2019-05-20 (×2): 1 g via INTRAVENOUS
  Filled 2019-05-18 (×3): qty 10

## 2019-05-18 MED ORDER — ADULT MULTIVITAMIN W/MINERALS CH
1.0000 | ORAL_TABLET | Freq: Every day | ORAL | Status: DC
  Administered 2019-05-20 – 2019-05-21 (×2): 1 via ORAL
  Filled 2019-05-18 (×2): qty 1

## 2019-05-18 MED ORDER — MIRTAZAPINE 7.5 MG PO TABS
7.5000 mg | ORAL_TABLET | Freq: Every day | ORAL | Status: DC
  Administered 2019-05-19 – 2019-05-20 (×2): 7.5 mg via ORAL
  Filled 2019-05-18 (×4): qty 1

## 2019-05-18 NOTE — Consult Note (Signed)
[facility] Nurse wound consult note Reason for Consult:Two areas of reddened, nonblanchable erythema to back and right buttock. Left buttock with small skin tear. May be indicative of trauma during transfer or deep tissue pressure injury, but at this time, no warmth or induration.Also noted is a discoloration of two toes on the right foot (2nd and 3rd digits). Recently discharged (2 days ago) to assisted living facility following brief admission to hospital. Wound type:Pressure vs trauma. Pressure Injury POA: Yes Measurement:  Mid-back: 3cm x 1.5cm (nonblanching erythema) Right buttock: 2cm round (nonblanching erythema) Left buttock: 1.5cm x 1cm x 0.1cm Wound bed:As described above Drainage (amount, consistency, odor)  Periwound:intact, very dry Dressing procedure/placement/frequency:I will implement a conservative POC for the integumentary system to include a mattress replacement with low air loss feature, pressure redistribution heel boots, a sacral prophylactic foam dressing and adherence to a turning and repositioning schedule that is our usual and customary procedure. HOB elevation is to be at or below a 30 degree angle as tolerated.  Consider nutritional consult.  If you agree, please order.  Thank you for inviting [place] to participate in the care of [name]. [facility] nursing team will not follow, but will remain available to this patient, the nursing and medical teams.  Please re-consult if needed.   Beth Flakes, MSN, RN, [place], [name]  Pager# [phone]

## 2019-05-18 NOTE — ED Provider Notes (Signed)
[facility] EMERGENCY DEPARTMENT Provider Note   CSN: [id number] Arrival date & time: 05/18/19  1139    History   Chief Complaint No chief complaint on file.   HPI [name] is a 85 y.o. female.     85 year old female with prior medical history as detailed below presents for evaluation of altered mental status, decreased p.o. intake, and possible dehydration.  Patient with recent admission to [facility] for similar complaints.  Patient was treated with Rocephin.  She was discharged on Keflex.  She presents now from her facility with recurrent reported decreased p.o. intake.  Patient is difficult to arouse on initial evaluation.  She is not able to answer questions.  The history is provided by the patient, medical records and the EMS personnel. The history is limited by the condition of the patient.  Altered Mental Status  Presenting symptoms: disorientation, lethargy and partial responsiveness   Severity:  Moderate Most recent episode:  2 days ago Episode history:  Single Timing:  Constant Progression:  Waxing and waning Chronicity:  Recurrent Context: dementia     Past Medical History:  Diagnosis Date  . Anxiety   . Arthritis   . Colon cancer ([place])   . Dementia ([place])   . Fibromyalgia   . GERD (gastroesophageal reflux disease)   . Hypertension   . Ovarian cancer [facility])     Patient Active Problem List   Diagnosis Date Noted  . Pressure injury of skin 05/16/2019  . AKI (acute kidney injury) ([place]) 05/15/2019  . DNR (do not resuscitate) 01/25/2015  . Weakness generalized 01/25/2015  . Palliative care encounter 01/25/2015  . UTI (urinary tract infection) 01/24/2015  . Hypoxia 01/23/2015  . Elevated troponin 01/23/2015  . CKD (chronic kidney disease) stage 3, GFR 30-59 ml/min (HCC) 01/23/2015  . Chronic anemia 01/23/2015  . SOB (shortness of breath) 01/23/2015  . Cough     Past Surgical History:  Procedure Laterality Date  . ABDOMINAL  HYSTERECTOMY    . COLON SURGERY       OB History   No obstetric history on file.      Home Medications    Prior to Admission medications   Medication Sig Start Date End Date Taking? Authorizing Provider  cephALEXin (KEFLEX) 250 MG capsule Take 1 capsule (250 mg total) by mouth 3 (three) times daily for 5 days. 05/16/19 05/21/19  Barb Merino, MD  famotidine (PEPCID) 20 MG tablet Take 20 mg by mouth at bedtime. 04/28/19   [provider]  folic acid (FOLVITE) 1 MG tablet Take 1 mg by mouth daily.    [provider]  Lactobacillus (PROBIOTIC ACIDOPHILUS) CAPS Take 1 capsule by mouth 2 (two) times a day.    [provider]  loratadine (CLARITIN) 10 MG tablet Take 10 mg by mouth daily.    [provider]  mirtazapine (REMERON) 7.5 MG tablet Take 7.5 mg by mouth at bedtime.    [provider]  Multiple Vitamins-Minerals (MULTIVITAMIN ADULT PO) Take 1 capsule by mouth daily.    [provider]  ondansetron (ZOFRAN) 8 MG tablet Take 8 mg by mouth 3 (three) times daily as needed for nausea or vomiting.     [provider]  oxyCODONE-acetaminophen (PERCOCET/ROXICET) 5-325 MG tablet Take 1 tablet by mouth 3 (three) times daily. 11/20/15   Domenic Moras, PA-C  OXYGEN Inhale 2 L into the lungs continuous.    [provider]  protein supplement (RESOURCE BENEPROTEIN) POWD Take  1 Scoop by mouth 2 (two) times a day.    [provider]  ranitidine (ZANTAC) 150 MG tablet Take 150 mg by mouth 2 (two) times daily.    [provider]  senna (SENOKOT) 8.6 MG TABS tablet Take 1 tablet by mouth daily.    [provider]  senna-docusate (SENOKOT-S) 8.6-50 MG per tablet Take 1 tablet by mouth daily. 01/26/15   Nat Math, MD  sertraline (ZOLOFT) 25 MG tablet Take 75 mg by mouth at bedtime.    [provider]  Skin Protectants, Misc. (EUCERIN) cream Apply 1 application topically 2 (two) times daily. Applies  to lower legs    [provider]    Family History No family history on file.  Social History Social History   Tobacco Use  . Smoking status: Never Smoker  . Smokeless tobacco: Never Used  Substance Use Topics  . Alcohol use: Yes  . Drug use: No     Allergies   Patient has no known allergies.   Review of Systems Review of Systems  All other systems reviewed and are negative.    Physical Exam Updated Vital Signs BP (!) 125/57   Pulse 62   Temp 99 F (37.2 C) (Rectal)   Resp 13   SpO2 100%   Physical Exam Vitals signs and nursing note reviewed.  Constitutional:      General: She is not in acute distress.    Appearance: She is well-developed.  HENT:     Head: Normocephalic and atraumatic.  Eyes:     Conjunctiva/sclera: Conjunctivae normal.     Pupils: Pupils are equal, round, and reactive to light.  Neck:     Musculoskeletal: Normal range of motion and neck supple.  Cardiovascular:     Rate and Rhythm: Normal rate and regular rhythm.     Heart sounds: Normal heart sounds.  Pulmonary:     Effort: Pulmonary effort is normal. No respiratory distress.     Breath sounds: Normal breath sounds.  Abdominal:     General: There is no distension.     Palpations: Abdomen is soft.     Tenderness: There is no abdominal tenderness.  Musculoskeletal: Normal range of motion.        General: No deformity.  Skin:    General: Skin is warm and dry.  Neurological:     General: No focal deficit present.     Mental Status: She is alert.     Comments: Minimally responsive - opens eyes with verbal stimulation, but will not answer.  After administration of Dextrose, MS did improve with patient now able to answer simple questions         ED Treatments / Results  Labs (all labs ordered are listed, but only abnormal results are displayed) Labs Reviewed  COMPREHENSIVE METABOLIC PANEL - Abnormal; Notable for the following components:      Result Value   CO2 20  (*)    BUN 24 (*)    Creatinine, Ser 1.28 (*)    Calcium 10.4 (*)    Albumin 3.4 (*)    GFR calc non Af Amer 35 (*)    GFR calc Af Amer 40 (*)    All other components within normal limits  CBC WITH DIFFERENTIAL/PLATELET - Abnormal; Notable for the following components:   Platelets 132 (*)    Neutro Abs 1.6 (*)    All other components within normal limits  TROPONIN I - Abnormal; Notable for the following components:  Troponin I 0.03 (*)    All other components within normal limits  URINALYSIS, ROUTINE W REFLEX MICROSCOPIC - Abnormal; Notable for the following components:   Glucose, UA >=500 (*)    Hgb urine dipstick SMALL (*)    Ketones, ur 5 (*)    Protein, ur 30 (*)    Leukocytes,Ua MODERATE (*)    Bacteria, UA RARE (*)    All other components within normal limits  CBG MONITORING, ED - Abnormal; Notable for the following components:   Glucose-Capillary 58 (*)    All other components within normal limits  CBG MONITORING, ED - Abnormal; Notable for the following components:   Glucose-Capillary 136 (*)    All other components within normal limits  SARS CORONAVIRUS 2 (HOSPITAL ORDER, [place] [facility])  CULTURE, BLOOD (ROUTINE X 2)  CULTURE, BLOOD (ROUTINE X 2)  PROTIME-INR    EKG EKG Interpretation  Date/Time:  Monday May 18 2019 11:51:26 EDT Ventricular Rate:  71 PR Interval:    QRS Duration: 132 QT Interval:  398 QTC Calculation: 433 R Axis:   84 Text Interpretation:  Atrial fibrillation Ventricular premature complex Nonspecific intraventricular conduction delay Anterior infarct, old Confirmed by Dene Gentry [phone]) on 05/18/2019 12:00:01 PM   Radiology Ct Head Wo Contrast  Result Date: 05/18/2019 CLINICAL DATA:  Altered mental status EXAM: CT HEAD WITHOUT CONTRAST TECHNIQUE: Contiguous axial images were obtained from the base of the skull through the vertex without intravenous contrast. COMPARISON:  CT head 05/15/2019 FINDINGS: Brain:  Moderate atrophy. Microvascular ischemic change in the white matter bilaterally is stable. No acute infarct, hemorrhage, or mass Vascular: Negative for hyperdense vessel Skull: Negative Sinuses/Orbits: Polypoid mass left nasopharynx unchanged measuring 18 x 10 mm. Bilateral cataract surgery. Other: None IMPRESSION: Atrophy and chronic microvascular ischemic change. No acute intracranial abnormality 18 x 10 mm polypoid lesion left nasopharynx unchanged. Recommend direct visualization. Electronically Signed   By: Franchot Gallo M.D.   On: 05/18/2019 13:53   Dg Chest Port 1 View  Result Date: 05/18/2019 CLINICAL DATA:  Weakness, altered mental status. EXAM: PORTABLE CHEST 1 VIEW COMPARISON:  Radiograph of May 15, 2019. FINDINGS: The heart size and mediastinal contours are within normal limits. Both lungs are clear. The visualized skeletal structures are unremarkable. IMPRESSION: No active disease. Electronically Signed   By: Marijo Conception M.D.   On: 05/18/2019 12:54    Procedures Procedures (including critical care time)  Medications Ordered in ED Medications  dextrose 50 % solution 50 mL (50 mLs Intravenous Given 05/18/19 1229)  sodium chloride 0.9 % bolus 500 mL (0 mLs Intravenous Stopped 05/18/19 1400)  cefTRIAXone (ROCEPHIN) 1 g in sodium chloride 0.9 % 100 mL IVPB (0 g Intravenous Stopped 05/18/19 1515)     Initial Impression / Assessment and Plan / ED Course  I have reviewed the triage vital signs and the nursing notes.  Pertinent labs & imaging results that were available during my care of the patient were reviewed by me and considered in my medical decision making (see chart for details).        MDM  Screen complete  TYIANNA MENEFEE was evaluated in Emergency Department on 05/18/2019 for the symptoms described in the history of present illness. She was evaluated in the context of the global COVID-19 pandemic, which necessitated consideration that the patient might be at risk for  infection with the SARS-CoV-2 virus that causes COVID-19. Institutional protocols and algorithms that pertain to the evaluation of  patients at risk for COVID-19 are in a state of rapid change based on information released by regulatory bodies including the CDC and federal and state organizations. These policies and algorithms were followed during the patient's care in the ED.  She is presenting for evaluation of altered mental status in the setting of recent UTI and dehydration.  Patient's clinical exam suggest recurrent dehydration.  Patient's mental status did improve after administration of dextrose.  Screening labs do demonstrate mild degree of dehydration.  Patient likely would benefit from admission.  Hospitalist service is aware of case and will evaluate for same.  Final Clinical Impressions(s) / ED Diagnoses   Final diagnoses:  Dehydration  Altered mental status, unspecified altered mental status type    ED Discharge Orders    None       Valarie Merino, MD 05/18/19 1627

## 2019-05-18 NOTE — ED Triage Notes (Signed)
Altered mental status x 3 days.  Recently returned to faciity from hospital and reports not the same.  Family notified of transport after EMS called. Pt unable to answer any triage questions.  Alert to self only.

## 2019-05-18 NOTE — ED Notes (Signed)
ED TO INPATIENT HANDOFF REPORT  ED Nurse Name and Phone #:   S Name/Age/Gender [name] 85 y.o. female Room/Bed: 041C/041C  Code Status   Code Status: DNR  Home/SNF/Other Skilled nursing facility Patient oriented to: self Is this baseline? No   Triage Complete: Triage complete  Chief Complaint poss/uti/altered  Triage Note Altered mental status x 3 days.  Recently returned to faciity from hospital and reports not the same.  Family notified of transport after EMS called. Pt unable to answer any triage questions.  Alert to self only.    Allergies No Known Allergies  Level of Care/Admitting Diagnosis ED Disposition    ED Disposition Condition [facility] Area: [place] [[id number]]  Level of Care: Med-Surg [16]  I expect the patient will be discharged within 24 hours: No (not a candidate for 5C-Observation unit)  Covid Evaluation: N/A  Diagnosis: Failure to thrive (0-17) [144818]  Admitting Physician: Guilford Shi [[id number]]  Attending Physician: Guilford Shi [[id number]]  PT Class (Do Not Modify): Observation [104]  PT Acc Code (Do Not Modify): Observation [10022]       B Medical/Surgery History Past Medical History:  Diagnosis Date  . Anxiety   . Arthritis   . Colon cancer ([place])   . Dementia ([place])   . Fibromyalgia   . GERD (gastroesophageal reflux disease)   . Hypertension   . Ovarian cancer [facility])    Past Surgical History:  Procedure Laterality Date  . ABDOMINAL HYSTERECTOMY    . COLON SURGERY       A IV Location/Drains/Wounds Patient Lines/Drains/Airways Status   Active Line/Drains/Airways    Name:   Placement date:   Placement time:   Site:   Days:   Peripheral IV 05/18/19 Right Forearm   05/18/19    -    Forearm   less than 1   Pressure Injury 05/15/19 Stage I -  Intact skin with non-blanchable redness of a localized area usually over a bony prominence. stage I to mid back on spine, reddened, skin  unbroken   05/15/19    2000     3   Pressure Injury 05/15/19 Stage I -  Intact skin with non-blanchable redness of a localized area usually over a bony prominence. reddened area on right buttock near gluteal fold. skin intact   05/15/19    2000     3   Wound / Incision (Open or Dehisced) 05/15/19 Non-pressure wound Toe (Comment  which one) Right R foot, 2nd/3rd toes blackened callous type wounds    05/15/19    2000    Toe (Comment  which one)   3          Intake/Output Last 24 hours  Intake/Output Summary (Last 24 hours) at 05/18/2019 1909 Last data filed at 05/18/2019 1515 Gross per 24 hour  Intake 600 ml  Output -  Net 600 ml    Labs/Imaging Results for orders placed or performed during the hospital encounter of 05/18/19 (from the past 48 hour(s))  CBG monitoring, ED     Status: Abnormal   Collection Time: 05/18/19 11:54 AM  Result Value Ref Garner   Glucose-Capillary 58 (L) 70 - 99 mg/dL   Comment 1 Notify RN    Comment 2 Document in Chart   Comprehensive metabolic panel     Status: Abnormal   Collection Time: 05/18/19 12:24 PM  Result Value Ref Garner   Sodium 141 135 - 145 mmol/L  Potassium 4.3 3.5 - 5.1 mmol/L   Chloride 108 98 - 111 mmol/L   CO2 20 (L) 22 - 32 mmol/L   Glucose, Bld 80 70 - 99 mg/dL   BUN 24 (H) 8 - 23 mg/dL   Creatinine, Ser 1.28 (H) 0.44 - 1.00 mg/dL   Calcium 10.4 (H) 8.9 - 10.3 mg/dL   Total Protein 7.2 6.5 - 8.1 g/dL   Albumin 3.4 (L) 3.5 - 5.0 g/dL   AST 23 15 - 41 U/L   ALT 9 0 - 44 U/L   Alkaline Phosphatase 68 38 - 126 U/L   Total Bilirubin 0.8 0.3 - 1.2 mg/dL   GFR calc non Af Amer 35 (L) >60 mL/min   GFR calc Af Amer 40 (L) >60 mL/min   Anion gap 13 5 - 15    Comment: Performed at [facility], [address] [place], [place] [address]  CBC with Differential     Status: Abnormal   Collection Time: 05/18/19 12:24 PM  Result Value Ref Garner   WBC 4.1 4.0 - 10.5 K/uL   RBC 4.61 3.87 - 5.11 MIL/uL   Hemoglobin 13.5 12.0 - 15.0 g/dL    HCT 43.3 36.0 - 46.0 %   MCV 93.9 80.0 - 100.0 fL   MCH 29.3 26.0 - 34.0 pg   MCHC 31.2 30.0 - 36.0 g/dL   RDW 14.9 11.5 - 15.5 %   Platelets 132 (L) 150 - 400 K/uL   nRBC 0.0 0.0 - 0.2 %   Neutrophils Relative % 39 %   Neutro Abs 1.6 (L) 1.7 - 7.7 K/uL   Lymphocytes Relative 48 %   Lymphs Abs 2.0 0.7 - 4.0 K/uL   Monocytes Relative 10 %   Monocytes Absolute 0.4 0.1 - 1.0 K/uL   Eosinophils Relative 1 %   Eosinophils Absolute 0.0 0.0 - 0.5 K/uL   Basophils Relative 1 %   Basophils Absolute 0.0 0.0 - 0.1 K/uL   Immature Granulocytes 1 %   Abs Immature Granulocytes 0.02 0.00 - 0.07 K/uL    Comment: Performed at [facility] [address] [place], [place] [address]  Troponin I - Once     Status: Abnormal   Collection Time: 05/18/19 12:24 PM  Result Value Ref Garner   Troponin I 0.03 (HH) <0.03 ng/mL    Comment: CRITICAL RESULT CALLED TO, READ BACK BY AND VERIFIED WITH: M.Rustyn Conery,RN 1406 05/18/2019 CLARK,S Performed at [facility] [address], [place],  [address]   CBG monitoring, ED     Status: Abnormal   Collection Time: 05/18/19 12:44 PM  Result Value Ref Garner   Glucose-Capillary 136 (H) 70 - 99 mg/dL  Urinalysis, Routine w reflex microscopic     Status: Abnormal   Collection Time: 05/18/19  1:10 PM  Result Value Ref Garner   Color, Urine YELLOW YELLOW   APPearance CLEAR CLEAR   Specific Gravity, Urine 1.020 1.005 - 1.030   pH 6.0 5.0 - 8.0   Glucose, UA >=500 (A) NEGATIVE mg/dL   Hgb urine dipstick SMALL (A) NEGATIVE   Bilirubin Urine NEGATIVE NEGATIVE   Ketones, ur 5 (A) NEGATIVE mg/dL   Protein, ur 30 (A) NEGATIVE mg/dL   Nitrite NEGATIVE NEGATIVE   Leukocytes,Ua MODERATE (A) NEGATIVE   RBC / HPF 0-5 0 - 5 RBC/hpf   WBC, UA 6-10 0 - 5 WBC/hpf   Bacteria, UA RARE (A) NONE SEEN   Squamous Epithelial / LPF  0-5 0 - 5   Hyaline Casts, UA PRESENT     Comment: Performed at [facility], [place] [address] [place], [place] [address]  SARS  Coronavirus 2 (CEPHEID - Performed in [place] hospital lab), Hosp Order     Status: None   Collection Time: 05/18/19  1:55 PM  Result Value Ref Garner   SARS Coronavirus 2 NEGATIVE NEGATIVE    Comment: (NOTE) If result is NEGATIVE SARS-CoV-2 target nucleic acids are NOT DETECTED. The SARS-CoV-2 RNA is generally detectable in upper and lower  respiratory specimens during the acute phase of infection. The lowest  concentration of SARS-CoV-2 viral copies this assay can detect is 250  copies / mL. A negative result does not preclude SARS-CoV-2 infection  and should not be used as the sole basis for treatment or other  patient management decisions.  A negative result may occur with  improper specimen collection / handling, submission of specimen other  than nasopharyngeal swab, presence of viral mutation(s) within the  areas targeted by this assay, and inadequate number of viral copies  (<250 copies / mL). A negative result must be combined with clinical  observations, patient history, and epidemiological information. If result is POSITIVE SARS-CoV-2 target nucleic acids are DETECTED. The SARS-CoV-2 RNA is generally detectable in upper and lower  respiratory specimens dur ing the acute phase of infection.  Positive  results are indicative of active infection with SARS-CoV-2.  Clinical  correlation with patient history and other diagnostic information is  necessary to determine patient infection status.  Positive results do  not rule out bacterial infection or co-infection with other viruses. If result is PRESUMPTIVE POSTIVE SARS-CoV-2 nucleic acids MAY BE PRESENT.   A presumptive positive result was obtained on the submitted specimen  and confirmed on repeat testing.  While 2019 novel coronavirus  (SARS-CoV-2) nucleic acids may be present in the submitted sample  additional confirmatory testing may be necessary for epidemiological  and / or clinical management purposes  to  differentiate between  SARS-CoV-2 and other Sarbecovirus currently known to infect humans.  If clinically indicated additional testing with an alternate test  methodology [phone]) is advised. The SARS-CoV-2 RNA is generally  detectable in upper and lower respiratory sp ecimens during the acute  phase of infection. The expected result is Negative. Fact Sheet for Patients:  StrictlyIdeas.no Fact Sheet for Healthcare Providers: BankingDealers.co.za This test is not yet approved or cleared by the [place] FDA and has been authorized for detection and/or diagnosis of SARS-CoV-2 by FDA under an Emergency Use Authorization (EUA).  This EUA will remain in effect (meaning this test can be used) for the duration of the COVID-19 declaration under Section 564(b)(1) of the Act, 21 U.S.C. section 360bbb-3(b)(1), unless the authorization is terminated or revoked sooner. Performed at [facility], [place] [address], [place], [place] [address]    Ct Head Wo Contrast  Result Date: 05/18/2019 CLINICAL DATA:  Altered mental status EXAM: CT HEAD WITHOUT CONTRAST TECHNIQUE: Contiguous axial images were obtained from the base of the skull through the vertex without intravenous contrast. COMPARISON:  CT head 05/15/2019 FINDINGS: Brain: Moderate atrophy. Microvascular ischemic change in the white matter bilaterally is stable. No acute infarct, hemorrhage, or mass Vascular: Negative for hyperdense vessel Skull: Negative Sinuses/Orbits: Polypoid mass left nasopharynx unchanged measuring 18 x 10 mm. Bilateral cataract surgery. Other: None IMPRESSION: Atrophy and chronic microvascular ischemic change. No acute intracranial abnormality 18 x 10 mm polypoid lesion left nasopharynx  unchanged. Recommend direct visualization. Electronically Signed   By: Franchot Gallo M.D.   On: 05/18/2019 13:53   Dg Chest Port 1 View  Result Date: 05/18/2019 CLINICAL DATA:  Weakness,  altered mental status. EXAM: PORTABLE CHEST 1 VIEW COMPARISON:  Radiograph of May 15, 2019. FINDINGS: The heart size and mediastinal contours are within normal limits. Both lungs are clear. The visualized skeletal structures are unremarkable. IMPRESSION: No active disease. Electronically Signed   By: Marijo Conception M.D.   On: 05/18/2019 12:54    Pending Labs Unresulted Labs (From admission, onward)    Start     Ordered   05/19/19 0500  TSH  Tomorrow morning,   R     05/18/19 1807   05/19/19 2229  Basic metabolic panel  Tomorrow morning,   R     05/18/19 1807   05/18/19 1809  Culture, Urine  Once,   R     05/18/19 1808   05/18/19 1243  Protime-INR  Once,   R     05/18/19 1243   05/18/19 1206  Culture, blood (routine x 2)  BLOOD CULTURE X 2,   STAT     05/18/19 1208          Vitals/Pain Today's Vitals   05/18/19 1700 05/18/19 1715 05/18/19 1730 05/18/19 1745  BP: (!) 129/59 (!) 144/71 135/79 140/65  Pulse: 64 72 68 72  Resp: 13 14 18 14   Temp:      TempSrc:      SpO2: 100% 100% 100% 100%    Isolation Precautions No active isolations  Medications Medications  mirtazapine (REMERON) tablet 7.5 mg (has no administration in time Garner)  famotidine (PEPCID) tablet 20 mg (has no administration in time Garner)  Probiotic Acidophilus CAPS 1 capsule (has no administration in time Garner)  ondansetron (ZOFRAN) tablet 8 mg (has no administration in time Garner)  senna-docusate (Senokot-S) tablet 1 tablet (has no administration in time Garner)  folic acid (FOLVITE) tablet 1 mg (has no administration in time Garner)  Multivitamin Adult TABS (has no administration in time Garner)  protein supplement (RESOURCE BENEPROTEIN) powder 6 g (has no administration in time Garner)  eucerin cream 1 application (has no administration in time Garner)  acetaminophen (TYLENOL) tablet 650 mg (has no administration in time Garner)    Or  acetaminophen (TYLENOL) suppository 650 mg (has no administration in time  Garner)  cefTRIAXone (ROCEPHIN) 1 g in sodium chloride 0.9 % 100 mL IVPB (has no administration in time Garner)  dextrose 5 %-0.9 % sodium chloride infusion ( Intravenous New Bag/Given 05/18/19 1835)  dextrose 50 % solution 50 mL (50 mLs Intravenous Given 05/18/19 1229)  sodium chloride 0.9 % bolus 500 mL (0 mLs Intravenous Stopped 05/18/19 1400)  cefTRIAXone (ROCEPHIN) 1 g in sodium chloride 0.9 % 100 mL IVPB (0 g Intravenous Stopped 05/18/19 1515)    Mobility non-ambulatory     Focused Assessments Cardiac Assessment Handoff:    Lab Results  Component Value Date   CKTOTAL 135 06/29/2011   CKMB 4.1 (H) 06/29/2011   TROPONINI 0.03 (HH) 05/18/2019   Lab Results  Component Value Date   DDIMER 1.52 (H) 01/24/2015   Does the Patient currently have chest pain? No     R Recommendations: See Admitting Provider Note  Report given to:   Additional Notes:

## 2019-05-18 NOTE — H&P (Addendum)
History and Physical    DOA: 05/18/2019  PCP: Patient, No Pcp Per  Patient coming from: Skilled nursing facility  Chief Complaint: Lethargy/altered mental status  HPI: [name] is a 85 y.o. female with history h/o dementia, arthritis, chronic hypoxic respiratory failure on 2 L of oxygen and chronic debility, GERD who was brought to the emergency room from long-term nursing home with lethargy/AMS and recently treated UTI. Patient is poor historian due to advanced dementia. History was taken from nursing home records as well as emergency room interaction with EMS. According to the records, she was admitted to [facility] from 5/15 to 5/16 for AMS/ UTI /dehydration and treated with IV rocephin (2 doses) and discharged on Keflex (which she was receiving at SNF prior to that admission).   In the emergency room today, she was found with abnormal urine (although improved pyuria from last week) and slightly abnormal renal functions. She was also found to have BG of 58 and received 1 amp of dextrose. She is resumed on IV rocephin, getting IV fluids and requested to be admitted for failure to thrive./recurrent UTI and possible palliative care discussions.   Review of Systems: As per HPI otherwise 10 point review of systems negative.    Past Medical History:  Diagnosis Date   Anxiety    Arthritis    Colon cancer ([place])    Dementia ([place])    Fibromyalgia    GERD (gastroesophageal reflux disease)    Hypertension    Ovarian cancer ([place])     Past Surgical History:  Procedure Laterality Date   ABDOMINAL HYSTERECTOMY     COLON SURGERY      Social history:  reports that she has never smoked. She has never used smokeless tobacco. She reports current alcohol use. She reports that she does not use drugs.   No Known Allergies  No family history on file.    Prior to Admission medications   Medication Sig Start Date End Date Taking? Authorizing Provider  cephALEXin (KEFLEX) 250 MG  capsule Take 1 capsule (250 mg total) by mouth 3 (three) times daily for 5 days. 05/16/19 05/21/19  Barb Merino, MD  famotidine (PEPCID) 20 MG tablet Take 20 mg by mouth at bedtime. 04/28/19   [provider]  folic acid (FOLVITE) 1 MG tablet Take 1 mg by mouth daily.    [provider]  Lactobacillus (PROBIOTIC ACIDOPHILUS) CAPS Take 1 capsule by mouth 2 (two) times a day.    [provider]  loratadine (CLARITIN) 10 MG tablet Take 10 mg by mouth daily.    [provider]  mirtazapine (REMERON) 7.5 MG tablet Take 7.5 mg by mouth at bedtime.    [provider]  Multiple Vitamins-Minerals (MULTIVITAMIN ADULT PO) Take 1 capsule by mouth daily.    [provider]  ondansetron (ZOFRAN) 8 MG tablet Take 8 mg by mouth 3 (three) times daily as needed for nausea or vomiting.     [provider]  oxyCODONE-acetaminophen (PERCOCET/ROXICET) 5-325 MG tablet Take 1 tablet by mouth 3 (three) times daily. 11/20/15   Domenic Moras, PA-C  OXYGEN Inhale 2 L into the lungs continuous.    [provider]  protein supplement (RESOURCE BENEPROTEIN) POWD Take 1 Scoop by mouth 2 (two) times a day.    [provider]  ranitidine (ZANTAC) 150 MG tablet Take 150 mg by mouth 2 (two) times daily.    [provider]  senna (SENOKOT) 8.6 MG TABS tablet Take 1  tablet by mouth daily.    [provider]  senna-docusate (SENOKOT-S) 8.6-50 MG per tablet Take 1 tablet by mouth daily. 01/26/15   Nat Math, MD  sertraline (ZOLOFT) 25 MG tablet Take 75 mg by mouth at bedtime.    [provider]  Skin Protectants, Misc. (EUCERIN) cream Apply 1 application topically 2 (two) times daily. Applies to lower legs    [provider]    Physical Exam: Vitals:   05/18/19 1430 05/18/19 1445 05/18/19 1500 05/18/19 1515  BP: 117/74 102/62 132/76 (!) 125/57  Pulse: 61 65 67 62  Resp: 12 13 13 13   Temp:      TempSrc:      SpO2:  100% 100% 100% 100%    Constitutional: NAD, calm, comfortable Eyes: PERRL, lids and conjunctivae normal ENMT: Tongue coated, poor dentition Neck: normal, supple, no masses, no thyromegaly Respiratory: clear to auscultation bilaterally, no wheezing, no crackles. Normal respiratory effort. No accessory muscle use.  Cardiovascular: irregular rate and rhythm, no murmurs / rubs / gallops. No extremity edema. 2+ pedal pulses. No carotid bruits.  Abdomen: no tenderness, no masses palpated. No hepatosplenomegaly. Bowel sounds positive.  Musculoskeletal: Generalized muscular atrophy/wasting, no joint swellings noted Neurologic: Patient oriented to person only. Sensation intact,Strength 3/5 in all 4 extremities.  Psychiatric: . Alert and oriented x 1. Normal mood.  SKIN/catheters: Extensive bruising and skin tears along upper and lower extremities with ankle/foot decubiti/pressure dressings in place  Labs on Admission: I have personally reviewed following labs and imaging studies  CBC: Recent Labs  Lab 05/15/19 1351 05/16/19 0510 05/18/19 1224  WBC 8.0 5.6 4.1  NEUTROABS 6.0  --  1.6*  HGB 13.8 11.4* 13.5  HCT 42.9 36.6 43.3  MCV 93.5 96.1 93.9  PLT 206 143* 063*   Basic Metabolic Panel: Recent Labs  Lab 05/15/19 1351 05/16/19 0510 05/18/19 1224  NA 139 141 141  K 3.4* 3.4* 4.3  CL 106 110 108  CO2 24 23 20*  GLUCOSE 112* 79 80  BUN 44* 31* 24*  CREATININE 1.45* 1.11* 1.28*  CALCIUM 10.9* 10.5* 10.4*   GFR: CrCl cannot be calculated (Unknown ideal weight.). Liver Function Tests: Recent Labs  Lab 05/15/19 1351 05/18/19 1224  AST 19 23  ALT 9 9  ALKPHOS 72 68  BILITOT 0.8 0.8  PROT 7.5 7.2  ALBUMIN 3.9 3.4*   No results for input(s): LIPASE, AMYLASE in the last 168 hours. No results for input(s): AMMONIA in the last 168 hours. Coagulation Profile: No results for input(s): INR, PROTIME in the last 168 hours. Cardiac Enzymes: Recent Labs  Lab 05/18/19 1224    TROPONINI 0.03*   BNP (last 3 results) No results for input(s): PROBNP in the last 8760 hours. HbA1C: No results for input(s): HGBA1C in the last 72 hours. CBG: Recent Labs  Lab 05/18/19 1154 05/18/19 1244  GLUCAP 58* 136*   Lipid Profile: No results for input(s): CHOL, HDL, LDLCALC, TRIG, CHOLHDL, LDLDIRECT in the last 72 hours. Thyroid Function Tests: No results for input(s): TSH, T4TOTAL, FREET4, T3FREE, THYROIDAB in the last 72 hours. Anemia Panel: No results for input(s): VITAMINB12, FOLATE, FERRITIN, TIBC, IRON, RETICCTPCT in the last 72 hours. Urine analysis:    Component Value Date/Time   COLORURINE YELLOW 05/18/2019 1310   APPEARANCEUR CLEAR 05/18/2019 1310   LABSPEC 1.020 05/18/2019 1310   PHURINE 6.0 05/18/2019 1310   GLUCOSEU >=500 (A) 05/18/2019 1310   HGBUR SMALL (A) 05/18/2019 1310   BILIRUBINUR NEGATIVE 05/18/2019  1310   KETONESUR 5 (A) 05/18/2019 1310   PROTEINUR 30 (A) 05/18/2019 1310   UROBILINOGEN 1.0 01/23/2015 1945   NITRITE NEGATIVE 05/18/2019 1310   LEUKOCYTESUR MODERATE (A) 05/18/2019 1310    Radiological Exams on Admission: Ct Head Wo Contrast  Result Date: 05/18/2019 CLINICAL DATA:  Altered mental status EXAM: CT HEAD WITHOUT CONTRAST TECHNIQUE: Contiguous axial images were obtained from the base of the skull through the vertex without intravenous contrast. COMPARISON:  CT head 05/15/2019 FINDINGS: Brain: Moderate atrophy. Microvascular ischemic change in the white matter bilaterally is stable. No acute infarct, hemorrhage, or mass Vascular: Negative for hyperdense vessel Skull: Negative Sinuses/Orbits: Polypoid mass left nasopharynx unchanged measuring 18 x 10 mm. Bilateral cataract surgery. Other: None IMPRESSION: Atrophy and chronic microvascular ischemic change. No acute intracranial abnormality 18 x 10 mm polypoid lesion left nasopharynx unchanged. Recommend direct visualization. Electronically Signed   By: Franchot Gallo M.D.   On: 05/18/2019  13:53   Dg Chest Port 1 View  Result Date: 05/18/2019 CLINICAL DATA:  Weakness, altered mental status. EXAM: PORTABLE CHEST 1 VIEW COMPARISON:  Radiograph of May 15, 2019. FINDINGS: The heart size and mediastinal contours are within normal limits. Both lungs are clear. The visualized skeletal structures are unremarkable. IMPRESSION: No active disease. Electronically Signed   By: Marijo Conception M.D.   On: 05/18/2019 12:54    EKG: Independently reviewed.  Atrial fibrillation, poor quality with artifacts     Assessment and Plan:   1. Metabolic encephalopathy: Present on admission secondary to UTI versus dehydration versus medications.  Urine cultures from the most recent admission at [facility] (after patient received Keflex at SNF) showed nonsignificant bacterial colony-forming units.  Will repeat urine cultures but patient has no fever or white count. IV hydration.  Hold Zoloft, loratadine and opiates, resume Remeron.  Avoid sedatives.  2.  Failure to thrive/poor oral intake/dehydration: Upon last presentation patient had elevated creatinine at 1.45 which improved to 1.1 at the time of discharge.  Today elevated at 1.28 again.  She was also noted to be hypoglycemic on arrival and received 1 amp of D50 for blood glucose of 58.  Will obtain dysphagia evaluation when more awake but patient not candidate for PEG tube and will likely need palliative care/hospice discussions if noted to be high aspiration risk.  Pured diet with dextrose fluids until then.  CT head showed atrophy with advanced microvascular disease, nasal polyp.  3.Chronic hypoxic respiratory failure: Not sure of underlying etiology.  Resume 2 L nasal cannula.  Chest x-ray today clear with no infiltrates.  4.?  Atrial fibrillation with left bundle branch block: New versus old.  Patient's EKG tracing from May 8, 9 and May 18 suggestive of irregular rhythm with intraventricular conduction delay/LBBB.  Not a candidate for  anticoagulation due to extensive brusing.  Rate controlled.  5. Advanced dementia/chronic debility/multiple pressure ulcers: Goals of care will need to be discussed with family.  Will request palliative care consult.  Continue skin care/barrier cream.  Resume Remeron but hold Zoloft and other sedatives.  6. Chronic anemia: Stable  7.  GERD: on famotidine  8. Hypokalemia: recieved replacement in ED  DVT prophylaxis: [facility]. Lovenox deferred due to extensive bruising  Code Status: DNR/DNI Per outside facility/prior records  Family Communication: Not present bedside Consults called: Palliative care, SLP for dysphagia evaluation and wound care consult Admission status:  Patient admitted as observation as anticipated LOS less than 2 midnights.  Her COVID testing was negative  in the ED.  She can be transferred back to nursing facility if further work-up unrevealing and continues to improve clinically.  Addendum: Patient did not pass bedside swallow evaluation for pills.  Will keep n.p.o. until further evaluated by speech in a.m.   Guilford Shi MD [facility] Pager [phone]  If 7PM-7AM, please contact night-coverage [email or web address] Password [facility]  05/18/2019, 5:16 PM

## 2019-05-19 ENCOUNTER — Encounter (HOSPITAL_COMMUNITY): Payer: Self-pay | Admitting: Emergency Medicine

## 2019-05-19 ENCOUNTER — Other Ambulatory Visit: Payer: Self-pay

## 2019-05-19 DIAGNOSIS — F0390 Unspecified dementia without behavioral disturbance: Secondary | ICD-10-CM | POA: Diagnosis present

## 2019-05-19 DIAGNOSIS — N390 Urinary tract infection, site not specified: Secondary | ICD-10-CM | POA: Diagnosis present

## 2019-05-19 DIAGNOSIS — Z993 Dependence on wheelchair: Secondary | ICD-10-CM | POA: Diagnosis not present

## 2019-05-19 DIAGNOSIS — Z8543 Personal history of malignant neoplasm of ovary: Secondary | ICD-10-CM | POA: Diagnosis not present

## 2019-05-19 DIAGNOSIS — J9611 Chronic respiratory failure with hypoxia: Secondary | ICD-10-CM | POA: Diagnosis present

## 2019-05-19 DIAGNOSIS — Z1159 Encounter for screening for other viral diseases: Secondary | ICD-10-CM | POA: Diagnosis not present

## 2019-05-19 DIAGNOSIS — Z23 Encounter for immunization: Secondary | ICD-10-CM | POA: Diagnosis present

## 2019-05-19 DIAGNOSIS — E162 Hypoglycemia, unspecified: Secondary | ICD-10-CM | POA: Diagnosis present

## 2019-05-19 DIAGNOSIS — E876 Hypokalemia: Secondary | ICD-10-CM | POA: Diagnosis present

## 2019-05-19 DIAGNOSIS — N183 Chronic kidney disease, stage 3 (moderate): Secondary | ICD-10-CM | POA: Diagnosis present

## 2019-05-19 DIAGNOSIS — Z9981 Dependence on supplemental oxygen: Secondary | ICD-10-CM | POA: Diagnosis not present

## 2019-05-19 DIAGNOSIS — I4891 Unspecified atrial fibrillation: Secondary | ICD-10-CM | POA: Diagnosis present

## 2019-05-19 DIAGNOSIS — M1990 Unspecified osteoarthritis, unspecified site: Secondary | ICD-10-CM | POA: Diagnosis present

## 2019-05-19 DIAGNOSIS — G9341 Metabolic encephalopathy: Secondary | ICD-10-CM | POA: Diagnosis not present

## 2019-05-19 DIAGNOSIS — I447 Left bundle-branch block, unspecified: Secondary | ICD-10-CM | POA: Diagnosis present

## 2019-05-19 DIAGNOSIS — Z7189 Other specified counseling: Secondary | ICD-10-CM

## 2019-05-19 DIAGNOSIS — K219 Gastro-esophageal reflux disease without esophagitis: Secondary | ICD-10-CM | POA: Diagnosis present

## 2019-05-19 DIAGNOSIS — M25559 Pain in unspecified hip: Secondary | ICD-10-CM | POA: Diagnosis present

## 2019-05-19 DIAGNOSIS — R627 Adult failure to thrive: Secondary | ICD-10-CM | POA: Diagnosis present

## 2019-05-19 DIAGNOSIS — I129 Hypertensive chronic kidney disease with stage 1 through stage 4 chronic kidney disease, or unspecified chronic kidney disease: Secondary | ICD-10-CM | POA: Diagnosis present

## 2019-05-19 DIAGNOSIS — Z87440 Personal history of urinary (tract) infections: Secondary | ICD-10-CM | POA: Diagnosis not present

## 2019-05-19 DIAGNOSIS — D649 Anemia, unspecified: Secondary | ICD-10-CM | POA: Diagnosis not present

## 2019-05-19 DIAGNOSIS — R4182 Altered mental status, unspecified: Secondary | ICD-10-CM | POA: Diagnosis not present

## 2019-05-19 DIAGNOSIS — R6251 Failure to thrive (child): Secondary | ICD-10-CM | POA: Diagnosis not present

## 2019-05-19 DIAGNOSIS — M797 Fibromyalgia: Secondary | ICD-10-CM | POA: Diagnosis present

## 2019-05-19 DIAGNOSIS — E860 Dehydration: Secondary | ICD-10-CM | POA: Diagnosis not present

## 2019-05-19 DIAGNOSIS — D631 Anemia in chronic kidney disease: Secondary | ICD-10-CM | POA: Diagnosis present

## 2019-05-19 DIAGNOSIS — R5381 Other malaise: Secondary | ICD-10-CM | POA: Diagnosis present

## 2019-05-19 DIAGNOSIS — Z515 Encounter for palliative care: Secondary | ICD-10-CM

## 2019-05-19 LAB — BASIC METABOLIC PANEL
Anion gap: 8 (ref 5–15)
BUN: 15 mg/dL (ref 8–23)
CO2: 25 mmol/L (ref 22–32)
Calcium: 9.7 mg/dL (ref 8.9–10.3)
Chloride: 110 mmol/L (ref 98–111)
Creatinine, Ser: 1.02 mg/dL — ABNORMAL HIGH (ref 0.44–1.00)
GFR calc Af Amer: 53 mL/min — ABNORMAL LOW (ref >60)
GFR calc non Af Amer: 45 mL/min — ABNORMAL LOW (ref >60)
Glucose, Bld: 131 mg/dL — ABNORMAL HIGH (ref 70–99)
Potassium: 3.7 mmol/L (ref 3.5–5.1)
Sodium: 143 mmol/L (ref 135–145)

## 2019-05-19 LAB — TSH: TSH: 2.017 u[IU]/mL (ref 0.350–4.500)

## 2019-05-19 LAB — MRSA PCR SCREENING: MRSA by PCR: NEGATIVE

## 2019-05-19 MED ORDER — SODIUM CHLORIDE 0.9% FLUSH
10.0000 mL | Freq: Two times a day (BID) | INTRAVENOUS | Status: DC
  Administered 2019-05-20 (×2): 10 mL

## 2019-05-19 MED ORDER — SODIUM CHLORIDE 0.9% FLUSH: 10.0000 mL | INTRAVENOUS | Status: DC | PRN

## 2019-05-19 MED ORDER — HYDRALAZINE HCL 20 MG/ML IJ SOLN: 5.0000 mg | INTRAMUSCULAR | Status: DC | PRN

## 2019-05-19 NOTE — Social Work (Signed)
Pt is from [facility] not a SNF.   CSW continuing to follow for support with disposition.  Westley Hummer, MSW, [place] Work [phone]

## 2019-05-19 NOTE — Evaluation (Signed)
Clinical/Bedside Swallow Evaluation Patient Details  Name: ALBERT DEVAUL MRN: [id number] Date of Birth: 02-02-1918  Today's Date: 05/19/2019 Time: SLP Start Time (ACUTE ONLY): 0957 SLP Stop Time (ACUTE ONLY): 1010 SLP Time Calculation (min) (ACUTE ONLY): 13 min  Past Medical History:  Past Medical History:  Diagnosis Date  . Anxiety   . Arthritis   . Colon cancer ([place])   . Dementia ([place])   . Fibromyalgia   . GERD (gastroesophageal reflux disease)   . Hypertension   . Ovarian cancer [facility])    Past Surgical History:  Past Surgical History:  Procedure Laterality Date  . ABDOMINAL HYSTERECTOMY    . COLON SURGERY     HPI:  85 year old female with prior medical history GERDR, fibrolyalgia, dementia, ovarian cancer, colon cancer presents for evaluation of altered mental status, decreased p.o. intake, and possible dehydration. Per chart patient with recent admission to [facility] for similar complaints, found to have UTI and discharged 2 days ago. CT neg for acute change. CXR No active disease.   Assessment / Plan / Recommendation Clinical Impression  Pt without overt s/s aspiration during swallow assessment. She has 3 total teeth in poor condition and no overt oral-motor impairments. Alert, conversive, mildly confused and very pleasant. Oral control possibly discoordinated with risk of premature spill. Prolonged mastication of small piece graham cracker. Due to dementia she did not recall what type of food consumed previously. Puree (Dys 1) best option for now until/if confusion clears some and she is able to return to diet (if not puree) back at ALF. Recommend crush pills and full assist given risk is higher with dementia and general fragility. ST to sign off.    SLP Visit Diagnosis: Dysphagia, unspecified (R13.10)    Aspiration Risk  Mild aspiration risk    Diet Recommendation Dysphagia 1 (Puree);Thin liquid   Liquid Administration via: Cup;Straw Medication Administration: Crushed  with puree Supervision: Staff to assist with self feeding;Full supervision/cueing for compensatory strategies;Patient able to self feed Compensations: Slow rate;Small sips/bites Postural Changes: Seated upright at 90 degrees    Other  Recommendations Oral Care Recommendations: Oral care BID   Follow up Recommendations Skilled Nursing facility(for diet upgrade if able)      Frequency and Duration            Prognosis        Swallow Study   General HPI: 85 year old female with prior medical history GERDR, fibrolyalgia, dementia, ovarian cancer, colon cancer presents for evaluation of altered mental status, decreased p.o. intake, and possible dehydration. Per chart patient with recent admission to [facility] for similar complaints, found to have UTI and discharged 2 days ago. CT neg for acute change. CXR No active disease. Type of Study: Bedside Swallow Evaluation Previous Swallow Assessment: (none) Diet Prior to this Study: NPO Temperature Spikes Noted: No Respiratory Status: Nasal cannula History of Recent Intubation: No Behavior/Cognition: Alert;Cooperative;Pleasant mood;Confused;Requires cueing Oral Cavity Assessment: Other (comment)(discolored tongue "blackish/hairy" tongue) Oral Care Completed by SLP: Yes Oral Cavity - Dentition: Poor condition;Missing dentition Vision: Functional for self-feeding Self-Feeding Abilities: Needs assist Patient Positioning: Upright in bed Baseline Vocal Quality: Normal Volitional Cough: Strong Volitional Swallow: Able to elicit    Oral/Motor/Sensory Function Overall Oral Motor/Sensory Function: Within functional limits   Ice Chips Ice chips: Within functional limits Presentation: Spoon   Thin Liquid Thin Liquid: Within functional limits    Nectar Thick Nectar Thick Liquid: Not tested   Honey Thick Honey Thick Liquid: Not tested   Puree Puree:  Within functional limits   Solid     Solid: Impaired Oral Phase Functional Implications:  Prolonged oral transit      Houston Siren 05/19/2019,10:24 AM   Orbie Pyo Colvin Caroli.Ed Risk analyst [phone] Office [phone]

## 2019-05-19 NOTE — Social Work (Signed)
Acknowledging that pt family prefers return to [facility] with hospice. Spoke with Abigail Butts at [facility] [phone], they will send needed COVID paperwork and are able to assist pt care. Their preferred hospice provider is [facility].  CSW continuing to follow for support with disposition when medically appropriate.  Westley Hummer, MSW, [place] Work [phone]

## 2019-05-19 NOTE — Progress Notes (Signed)
The patient as tolerated small amounts of liquids today

## 2019-05-19 NOTE — Progress Notes (Addendum)
[facility] PROGRESS NOTE  SHARDAY MICHL VEH:[id number] DOB: [birth year] DOA: 05/18/2019 PCP: Patient, No Pcp Per  Assessment/Plan:  1. Metabolic encephalopathy: Present on admission secondary to UTI versus dehydration versus medications. Close to baseline this am. Oriented to self only. Chart review indicates urine cultures from the most recent admission at [facility] (after patient received Keflex at SNF) showed nonsignificant bacterial colony-forming units. Urinalysis with less bacteria but still elevated. CT head without acute abnormality. Reportedly decreased po intake of late.  She remains afebrile with no leukocytosis and non-toxic appearing. Of note, spoke with daughter who states patients baseline is wheelchair bound due to painful hip, but patient can transfer self from bed to chair and chair to commode. She rolls herself around the assisted living facility, participates in bingo, manicures, Control and instrumentation engineer. Is able to make wants and needs known.  - continue IV hydration.  -follow urine culute - Hold Zoloft, loratadine and opiates - resume Remeron.  - Avoid sedatives.  2.  Failure to thrive/poor oral intake/dehydration/hypoglycemia: Upon last presentation patient had elevated creatinine at 1.45 which improved to 1.1 at the time of discharge. On admission creatinine 1.28.  She was also noted to be hypoglycemic on arrival and received 1 amp of D50 for blood glucose of 58. IV fluid of D5NS started. Serum glucose 131 this am. Evaluated by speech who recommend prureed with crushed pills -continue IV fluid -encourage po intake -consider palliative care  3.Chronic hypoxic respiratory failure: oxygen at facility 2 L nasal cannula.  Chest x-ray today clear with no infiltrates.oxygen saturation level greater than 90% on 2L>  -continue oxygen  4.?  Atrial fibrillation with left bundle branch block: Patient's EKG tracing from May 8, 9 and May 18 suggestive of irregular rhythm with  intraventricular conduction delay/LBBB.  Not a candidate for anticoagulation due to extensive brusing.  Rate controlled. -monitor  5. Advanced dementia/chronic debility/multiple pressure ulcers:  Continue skin care/barrier cream.  Resume Remeron but hold Zoloft and other sedatives. Appreciate WOC assistance  6. Chronic anemia: Stable  7.  GERD: on famotidine  8. Hypokalemia: resolved this am recieved replacement in ED  DVT prophylaxis: [facility]. Lovenox deferred due to extensive bruising   Code Status: dnr Family Communication:  Disposition Plan: back to facility   Consultants:    Procedures:    Antibiotics:  Rocephin 5/18>>  HPI/Subjective: 85 yo admitted acute encephalopathy related to reccurrent uti, dehydration, hypoglycemia  Objective: Vitals:   05/19/19 0023 05/19/19 0359  BP: (!) 135/113 (!) 130/94  Pulse: 97 73  Resp: 16 20  Temp: 98.5 F (36.9 C) 98.3 F (36.8 C)  SpO2: 95% 94%    Intake/Output Summary (Last 24 hours) at 05/19/2019 1113 Last data filed at 05/19/2019 1100 Gross per 24 hour  Intake 600 ml  Output 450 ml  Net 150 ml   There were no vitals filed for this visit.  Exam:   General:  Thin frail chronically ill appearing but smiling  Cardiovascular: irregularly irregular no mgr no LE edema  Respiratory: normal effort respers shallow BS distant but clear  Abdomen: soft non-distended non-tender +BS no guarding or rebounding  Musculoskeletal: joints without swelling/erythema   Data Reviewed: Basic Metabolic Panel: Recent Labs  Lab 05/15/19 1351 05/16/19 0510 05/18/19 1224 05/19/19 0249  NA 139 141 141 143  K 3.4* 3.4* 4.3 3.7  CL 106 110 108 110  CO2 24 23 20* 25  GLUCOSE 112* 79 80 131*  BUN 44* 31* 24* 15  CREATININE  1.45* 1.11* 1.28* 1.02*  CALCIUM 10.9* 10.5* 10.4* 9.7   Liver Function Tests: Recent Labs  Lab 05/15/19 1351 05/18/19 1224  AST 19 23  ALT 9 9  ALKPHOS 72 68  BILITOT 0.8 0.8  PROT 7.5 7.2   ALBUMIN 3.9 3.4*   No results for input(s): LIPASE, AMYLASE in the last 168 hours. No results for input(s): AMMONIA in the last 168 hours. CBC: Recent Labs  Lab 05/15/19 1351 05/16/19 0510 05/18/19 1224  WBC 8.0 5.6 4.1  NEUTROABS 6.0  --  1.6*  HGB 13.8 11.4* 13.5  HCT 42.9 36.6 43.3  MCV 93.5 96.1 93.9  PLT 206 143* 132*   Cardiac Enzymes: Recent Labs  Lab 05/18/19 1224  TROPONINI 0.03*   BNP (last 3 results) No results for input(s): BNP in the last 8760 hours.  ProBNP (last 3 results) No results for input(s): PROBNP in the last 8760 hours.  CBG: Recent Labs  Lab 05/18/19 1154 05/18/19 1244 05/18/19 1915  GLUCAP 58* 136* 75    Recent Results (from the past 240 hour(s))  SARS Coronavirus 2 (CEPHEID - Performed in [place] hospital lab), Hosp Order     Status: None   Collection Time: 05/15/19  5:24 PM  Result Value Ref Range Status   SARS Coronavirus 2 NEGATIVE NEGATIVE Final    Comment: (NOTE) If result is NEGATIVE SARS-CoV-2 target nucleic acids are NOT DETECTED. The SARS-CoV-2 RNA is generally detectable in upper and lower  respiratory specimens during the acute phase of infection. The lowest  concentration of SARS-CoV-2 viral copies this assay can detect is 250  copies / mL. A negative result does not preclude SARS-CoV-2 infection  and should not be used as the sole basis for treatment or other  patient management decisions.  A negative result may occur with  improper specimen collection / handling, submission of specimen other  than nasopharyngeal swab, presence of viral mutation(s) within the  areas targeted by this assay, and inadequate number of viral copies  (<250 copies / mL). A negative result must be combined with clinical  observations, patient history, and epidemiological information. If result is POSITIVE SARS-CoV-2 target nucleic acids are DETECTED. The SARS-CoV-2 RNA is generally detectable in upper and lower  respiratory specimens  dur ing the acute phase of infection.  Positive  results are indicative of active infection with SARS-CoV-2.  Clinical  correlation with patient history and other diagnostic information is  necessary to determine patient infection status.  Positive results do  not rule out bacterial infection or co-infection with other viruses. If result is PRESUMPTIVE POSTIVE SARS-CoV-2 nucleic acids MAY BE PRESENT.   A presumptive positive result was obtained on the submitted specimen  and confirmed on repeat testing.  While 2019 novel coronavirus  (SARS-CoV-2) nucleic acids may be present in the submitted sample  additional confirmatory testing may be necessary for epidemiological  and / or clinical management purposes  to differentiate between  SARS-CoV-2 and other Sarbecovirus currently known to infect humans.  If clinically indicated additional testing with an alternate test  methodology [phone]) is advised. The SARS-CoV-2 RNA is generally  detectable in upper and lower respiratory sp ecimens during the acute  phase of infection. The expected result is Negative. Fact Sheet for Patients:  StrictlyIdeas.no Fact Sheet for Healthcare Providers: BankingDealers.co.za This test is not yet approved or cleared by the [place] FDA and has been authorized for detection and/or diagnosis of SARS-CoV-2 by FDA under an Emergency Use Authorization (EUA).  This EUA will remain in effect (meaning this test can be used) for the duration of the COVID-19 declaration under Section 564(b)(1) of the Act, 21 U.S.C. section 360bbb-3(b)(1), unless the authorization is terminated or revoked sooner. Performed at [facility], [place] [address] [place], [place] [address]   Culture, Urine     Status: Abnormal   Collection Time: 05/16/19  7:09 AM  Result Value Ref Range Status   Specimen Description   Final    URINE, RANDOM Performed at [facility] [address], [place], [place] [address]    Special Requests   Final    plz culture 5/15 urine Performed at [facility] [address], [place], [place] [address]    Culture (A)  Final    50,000 COLONIES/mL GRANULICATELLA ADIACENS Standardized susceptibility testing for this organism is not available. Performed at [facility], [place] [address] [place], [place] [address]    Report Status 05/18/2019 FINAL  Final  Culture, blood (routine x 2)     Status: None (Preliminary result)   Collection Time: 05/18/19  1:40 PM  Result Value Ref Range Status   Specimen Description BLOOD RIGHT FOREARM  Final   Special Requests   Final    BOTTLES DRAWN AEROBIC AND ANAEROBIC Blood Culture results may not be optimal due to an inadequate volume of blood received in culture bottles   Culture   Final    NO GROWTH < 24 HOURS Performed at [facility], [place] [address] [place], [place] [address]    Report Status PENDING  Incomplete  SARS Coronavirus 2 (CEPHEID - Performed in [place] hospital lab), Hosp Order     Status: None   Collection Time: 05/18/19  1:55 PM  Result Value Ref Range Status   SARS Coronavirus 2 NEGATIVE NEGATIVE Final    Comment: (NOTE) If result is NEGATIVE SARS-CoV-2 target nucleic acids are NOT DETECTED. The SARS-CoV-2 RNA is generally detectable in upper and lower  respiratory specimens during the acute phase of infection. The lowest  concentration of SARS-CoV-2 viral copies this assay can detect is 250  copies / mL. A negative result does not preclude SARS-CoV-2 infection  and should not be used as the sole basis for treatment or other  patient management decisions.  A negative result may occur with  improper specimen collection / handling, submission of specimen other  than nasopharyngeal swab, presence of viral mutation(s) within the  areas targeted by this assay, and inadequate number of viral copies  (<250 copies /  mL). A negative result must be combined with clinical  observations, patient history, and epidemiological information. If result is POSITIVE SARS-CoV-2 target nucleic acids are DETECTED. The SARS-CoV-2 RNA is generally detectable in upper and lower  respiratory specimens dur ing the acute phase of infection.  Positive  results are indicative of active infection with SARS-CoV-2.  Clinical  correlation with patient history and other diagnostic information is  necessary to determine patient infection status.  Positive results do  not rule out bacterial infection or co-infection with other viruses. If result is PRESUMPTIVE POSTIVE SARS-CoV-2 nucleic acids MAY BE PRESENT.   A presumptive positive result was obtained on the submitted specimen  and confirmed on repeat testing.  While 2019 novel coronavirus  (SARS-CoV-2) nucleic acids may be present in the submitted sample  additional confirmatory testing may be necessary for epidemiological  and / or clinical management purposes  to differentiate between  SARS-CoV-2 and other Sarbecovirus currently known to infect humans.  If clinically indicated additional testing with an alternate test  methodology [phone]) is advised. The SARS-CoV-2 RNA is generally  detectable in upper and lower respiratory sp ecimens during the acute  phase of infection. The expected result is Negative. Fact Sheet for Patients:  StrictlyIdeas.no Fact Sheet for Healthcare Providers: BankingDealers.co.za This test is not yet approved or cleared by the [place] FDA and has been authorized for detection and/or diagnosis of SARS-CoV-2 by FDA under an Emergency Use Authorization (EUA).  This EUA will remain in effect (meaning this test can be used) for the duration of the COVID-19 declaration under Section 564(b)(1) of the Act, 21 U.S.C. section 360bbb-3(b)(1), unless the authorization is terminated or revoked  sooner. Performed at [facility], [place] [address] [place], [place] [address]   Culture, blood (routine x 2)     Status: None (Preliminary result)   Collection Time: 05/18/19  2:55 PM  Result Value Ref Range Status   Specimen Description BLOOD LEFT ANTECUBITAL  Final   Special Requests   Final    BOTTLES DRAWN AEROBIC ONLY Blood Culture results may not be optimal due to an inadequate volume of blood received in culture bottles   Culture   Final    NO GROWTH < 24 HOURS Performed at [facility], [place] [address] [place], [place] [address]    Report Status PENDING  Incomplete     Studies: Ct Head Wo Contrast  Result Date: 05/18/2019 CLINICAL DATA:  Altered mental status EXAM: CT HEAD WITHOUT CONTRAST TECHNIQUE: Contiguous axial images were obtained from the base of the skull through the vertex without intravenous contrast. COMPARISON:  CT head 05/15/2019 FINDINGS: Brain: Moderate atrophy. Microvascular ischemic change in the white matter bilaterally is stable. No acute infarct, hemorrhage, or mass Vascular: Negative for hyperdense vessel Skull: Negative Sinuses/Orbits: Polypoid mass left nasopharynx unchanged measuring 18 x 10 mm. Bilateral cataract surgery. Other: None IMPRESSION: Atrophy and chronic microvascular ischemic change. No acute intracranial abnormality 18 x 10 mm polypoid lesion left nasopharynx unchanged. Recommend direct visualization. Electronically Signed   By: Franchot Gallo M.D.   On: 05/18/2019 13:53   Dg Chest Port 1 View  Result Date: 05/18/2019 CLINICAL DATA:  Weakness, altered mental status. EXAM: PORTABLE CHEST 1 VIEW COMPARISON:  Radiograph of May 15, 2019. FINDINGS: The heart size and mediastinal contours are within normal limits. Both lungs are clear. The visualized skeletal structures are unremarkable. IMPRESSION: No active disease. Electronically Signed   By: Marijo Conception M.D.   On: 05/18/2019 12:54    Scheduled Meds: . acidophilus  1 capsule Oral  BID  . famotidine  20 mg Oral QHS  . folic acid  1 mg Oral Daily  . hydrocerin  1 application Topical BID  . mirtazapine  7.5 mg Oral QHS  . multivitamin with minerals  1 tablet Oral Daily  . protein supplement  1 Scoop Oral BID  . senna-docusate  1 tablet Oral Daily   Continuous Infusions: . cefTRIAXone (ROCEPHIN)  IV    . dextrose 5 % and 0.9% NaCl 50 mL/hr at 05/19/19 1048    Principal Problem:   Metabolic encephalopathy Active Problems:   UTI (urinary tract infection)   Failure to thrive (0-17)   Hypoglycemia   CKD (chronic kidney disease) stage 3, GFR 30-59 ml/min (HCC)   Chronic anemia    Time spent: 45 minutes    Radene Gunning NP  [facility]  If 7PM-7AM, please contact night-coverage at [email or web address], password [facility] 05/19/2019, 11:13 AM  LOS: 0 days

## 2019-05-19 NOTE — Consult Note (Signed)
Consultation Note Date: 05/19/2019   Patient Name: [name]  DOB: May 09, 1918  MRN: [id number]  Age / Sex: 85 y.o., female   PCP: Patient, No Pcp Per Referring Physician: Geradine Girt, DO   REASON FOR CONSULTATION:Establishing goals of care  Palliative Care consult requested for this 85 y.o. female with multiple medical problems including arthritis, dementia, chronic hypoxic respiratory failure (2L home use), recurrent UTIs, fibromyalgia, GERD, hypertension, colon cancer, and ovarian cancer. Her daughter reports she is wheelchair bound. She was admitted from [facility] with lethargy, altered mental status, and possible UTI.    Clinical Assessment and Goals of Care: I have reviewed medical records including lab results, imaging, Epic notes, and MAR, received report from the bedside RN, and assessed the patient. I spoke with her daughter, [name] [facility])  to discuss diagnosis prognosis, [place], EOL wishes, disposition and options. Patient is awake and alert. She is able to appropriately engage in conversation with me. She was able to state her name, dob, and that she was in [place] (couldn't verbalize location stated "I know there are several hospitals here").   I introduced Palliative Medicine as specialized medical care for people living with serious illness. It focuses on providing relief from the symptoms and stress of a serious illness. The goal is to improve quality of life for both the patient and the family. Daughter verbalized her understanding of Palliative's role and her remembrance of being involved with her mother for several years.   We discussed a brief life review of the patient, along with  Her functional and nutritional status. Daughter reports she is the only child and greatly involved in her mom's care. She also shares that her daughter is a [employer] scientist. [name] states her mother is a 75 26 year old. She attends [employer] regularly, strong willed, requires assistance but  can transfer herself from bed to chair, and also goes to dining room to eat. Daughter does endorse patient having multiple areas of skin breakdown and decreased lower extremity circulation. She reports she has had this issue for some time now. She also reports some obvious signs of aspiration and decreased appetite. [name] reports her mom does have dementia however, she is able to engage in conversations, recognize and call facility staff by name, as well as her family members.   We discussed Her current illness and what it means in the larger context of Her on-going co-morbidities. With specific discussions regarding decreased mobility, deconditioning, chronic respiratory failure, recurrent UTIs, poor po intake, dehydration, hypoglycemic episodes, and high risk of aspiration. Natural disease trajectory and expectations at EOL were discussed. Daughter verbalized understanding of her mother's condition. She expressed she knows that she is 83 years old, yet she is very independent in mindset and determined. She expresses her feelings regarding how her mother's medical condition has failed over the months and her feelings towards the medical care she has received.   I attempted to elicit values and goals of care important to the patient.    The difference between aggressive medical intervention and comfort care was considered in light of the patient's goals of care. [name] expressed she would like for her mother to continue to receive necessary and needed medical treatment with no escalation of care which would involve aggressive procedures or interventions. She expressed her main goal is to allow her mom to live what time she has left to the fullest and in a comfortable state. Daughter understands and verbalizes her support of  her mother. She states "if she eats she eats if she doesn't she has the right not to[employer] given. She also states her mom's teeth have been rotting and falling out which she contributes  to medication and bone loss due to age.   Family is aware of risk of aspiration and expressed they wish to allow patient to have whatever it is she would want given she is 15 years ago despite the risk.   [name] confirms that her mom does have an advance directive and she [name]) is the [employer]. Patient was also able to verbalize this information to me. Patient and daughter confirms DNR/DNI.  NO PEG.   Hospice and Palliative Care services outpatient were explained and offered. Patient and family verbalized their understanding and awareness of both palliative and hospice's goals and philosophy of care. [name] reports being followed by Palliative for several years and states she has lost some trust in hospice as patient was placed on hospice services back in 2016 and told she would pass away soon and patient began thriving. She was under hospice care from 2016-2018 and was then discharged because she was doing so well.   I attempted to again discuss with daughter patient's current condition and how her body has changed over the past 4 years and obvious signs of decline. She verbalized understanding and expressed she wants her mom to be comfortable and have the support she needs and she did appreciate the care that has been provided. [name] agrees she would like to re-engage hospice and see how it works out. She understands if patient continues to thrive which may not be likely she can request Palliative versus hospice and maintain a relationship with that team. She verbalized understanding and appreciation. Again, [name] agrees and is requesting patient return back to [facility] with hospice support.   Questions and concerns were addressed. The family was encouraged to call with questions or concerns.  PMT will continue to support holistically.  The above conversation was completed via telephone due to the visitor restrictions during the COVID-19 pandemic. Thorough chart review and discussion with necessary  members of the care team was completed as part of assessment.   SOCIAL HISTORY:     reports that she has never smoked. She has never used smokeless tobacco. She reports current alcohol use. She reports that she does not use drugs.  CODE STATUS: DNR  ADVANCE DIRECTIVES: Primary Decision Maker: [name] HCPOA: YES    SYMPTOM MANAGEMENT: per attending   Palliative Prophylaxis:   Aspiration, Bowel Regimen, Delirium Protocol, Frequent Pain Assessment, Oral Care, Palliative Wound Care and Turn Reposition  PSYCHO-SOCIAL/SPIRITUAL:  Support System: Family  Desire for further Chaplaincy support:NO   Additional Recommendations (Limitations, Scope, Preferences):  Continue to treat, no escalation of care.    PAST MEDICAL HISTORY: Past Medical History:  Diagnosis Date   Anxiety    Arthritis    Colon cancer ([place])    Dementia ([place])    Fibromyalgia    GERD (gastroesophageal reflux disease)    Hypertension    Ovarian cancer ([place])     PAST SURGICAL HISTORY:  Past Surgical History:  Procedure Laterality Date   ABDOMINAL HYSTERECTOMY     COLON SURGERY      ALLERGIES:  has No Known Allergies.   MEDICATIONS:  Current Facility-Administered Medications  Medication Dose Route Frequency Provider Last Rate Last Dose   acetaminophen (TYLENOL) tablet 650 mg  650 mg Oral Q6H PRN Guilford Shi, MD  Or   acetaminophen (TYLENOL) suppository 650 mg  650 mg Rectal Q6H PRN Guilford Shi, MD       acidophilus (RISAQUAD) capsule 1 capsule  1 capsule Oral BID Guilford Shi, MD       cefTRIAXone (ROCEPHIN) 1 g in sodium chloride 0.9 % 100 mL IVPB  1 g Intravenous Q24H Guilford Shi, MD 200 mL/hr at 05/19/19 1238 1 g at 05/19/19 1238   dextrose 5 %-0.9 % sodium chloride infusion   Intravenous Continuous Radene Gunning, NP 50 mL/hr at 05/19/19 1230     famotidine (PEPCID) tablet 20 mg  20 mg Oral QHS Guilford Shi, MD       folic acid (FOLVITE) tablet 1  mg  1 mg Oral Daily Guilford Shi, MD       hydrALAZINE (APRESOLINE) injection 5 mg  5 mg Intravenous Q4H PRN Black, Lezlie Octave, NP       hydrocerin (EUCERIN) cream 1 application  1 application Topical BID Guilford Shi, MD   1 application at 85/46/27 1000   mirtazapine (REMERON) tablet 7.5 mg  7.5 mg Oral QHS Guilford Shi, MD       multivitamin with minerals tablet 1 tablet  1 tablet Oral Daily Kamineni, Neelima, MD       ondansetron (ZOFRAN) tablet 8 mg  8 mg Oral TID PRN Guilford Shi, MD       protein supplement (RESOURCE BENEPROTEIN) powder 6 g  1 Scoop Oral BID Kamineni, Neelima, MD       senna-docusate (Senokot-S) tablet 1 tablet  1 tablet Oral Daily Kamineni, Neelima, MD        VITAL SIGNS: BP (!) 130/94 (BP Location: Left Arm) Comment: nurse notified   Pulse 73    Temp 98.3 F (36.8 C) (Oral)    Resp 20    SpO2 94%  There were no vitals filed for this visit.  Estimated body mass index is 21.95 kg/m as calculated from the following:   Height as of 05/07/17: 5\' 2"  (1.575 m).   Weight as of 05/07/17: 54.4 kg.  LABS: CBC:    Component Value Date/Time   WBC 4.1 05/18/2019 1224   HGB 13.5 05/18/2019 1224   HCT 43.3 05/18/2019 1224   PLT 132 (L) 05/18/2019 1224   Comprehensive Metabolic Panel:    Component Value Date/Time   NA 143 05/19/2019 0249   K 3.7 05/19/2019 0249   CO2 25 05/19/2019 0249   BUN 15 05/19/2019 0249   CREATININE 1.02 (H) 05/19/2019 0249   ALBUMIN 3.4 (L) 05/18/2019 1224     Review of Systems Dementia unable to fully obtain  Physical Exam General: NAD, frail chronically -ill appearing, thin Cardiovascular: irregular rate and irregular rhythm, no edema, lower extremity discoloration, cool to touch  Pulmonary:diminished  Abdomen: soft, nontender, + bowel sounds Extremities: no edema,  Skin: scattered bruising, thin, multiple reported areas of skin breakdown, dressings intact Neurological: Weakness, confusion, hx of dementia     Prognosis: < 6 months in the setting of advanced dementia, recurrent UTIs, wheelchair bound, multiple pressure ulcerations, chronic debility, atrial fibrillation, chronic hypoxic respiratory failure, dehydration, hypoglycemia, encephalopathy, advanced age, and hypertension.   Discharge Planning:  Family requesting back to [facility] with hospice support   Recommendations:  DNR/DNI-as confirmed by patient/daughter, [name] [facility])  Continue to treat without escalation of care  Daughter's expressed goals is for patient to return to [facility]F with hospice support. Wants mom to be comfortable, continue to be with family/friends at facility,  allow her to eat and do what she wants with the time she has time left. She remains hopeful that she would show some signs of improvement but also prepared.   [place] with hospice support. CSW referral for hospice outpatient.   PMT will continue to support and follow as needed.    Palliative Performance Scale: PPS 20% ([place])               Daughter, [name] expressed understanding and was in agreement with this plan.   Thank you for allowing the Palliative Medicine Team to assist in the care of this patient.  Time In: 1130 Time Out: 1245  Time Total: 75 min.   Visit consisted of counseling and education dealing with the complex and emotionally intense issues of symptom management and palliative care in the setting of serious and potentially life-threatening illness.Greater than 50%  of this time was spent counseling and coordinating care related to the above assessment and plan.  Signed by:  Alda Lea, AGPCNP-BC Palliative Medicine Team  Phone: [phone] Fax: [phone] Pager: [phone] Amion: Bjorn Pippin

## 2019-05-20 DIAGNOSIS — D649 Anemia, unspecified: Secondary | ICD-10-CM

## 2019-05-20 DIAGNOSIS — Z66 Do not resuscitate: Secondary | ICD-10-CM

## 2019-05-20 MED ORDER — RISAQUAD PO CAPS: 1.0000 | ORAL_CAPSULE | Freq: Two times a day (BID) | ORAL | 1 refills | Status: AC

## 2019-05-20 MED ORDER — PNEUMOCOCCAL VAC POLYVALENT 25 MCG/0.5ML IJ INJ
0.5000 mL | INJECTION | INTRAMUSCULAR | Status: AC
  Administered 2019-05-20: 11:00:00 0.5 mL via INTRAMUSCULAR
  Filled 2019-05-20: qty 0.5

## 2019-05-20 NOTE — Discharge Summary (Addendum)
Physician Discharge Summary  [name] EHM:[id number] DOB: January 15, 1918 DOA: 05/18/2019  PCP: Patient, No Pcp Per  Admit date: 05/18/2019 Discharge date: 05/21/2019  Time spent: 45 minutes  Recommendations for Outpatient Follow-up:  1. Take medications as prescribed 2. Back to facility with Hospice care 3. Encourage po intake but do not force. (patient likes white powdered doughnuts). Would allow her anything she wants to eat   Discharge Diagnoses:  Principal Problem:   Metabolic encephalopathy Active Problems:   UTI (urinary tract infection)   Failure to thrive (0-17)   Hypoglycemia   CKD (chronic kidney disease) stage 3, GFR 30-59 ml/min (HCC)   Chronic anemia   Acute metabolic encephalopathy   Discharge Condition: stable  Diet recommendation: soft but is allowed anything she desires and can chew  Filed Weights   05/19/19 2147  Weight: 54.4 kg    History of present illness:  [name] is a 85 y.o. female with history h/o dementia, arthritis, chronic hypoxic respiratory failure on 2 L of oxygen and chronic debility, GERD whowas brought tothe emergency room from ALF on 5/18 with lethargy/AMS and recently treated UTI.  History was taken from nursing home records as well as emergency room interaction with EMS. According to the records, she was admitted to [facility] from 5/15 to 5/16 for AMS/ UTI /dehydration and treated with IV rocephin (2 doses) and discharged on Keflex (which she was receiving at SNF prior to that admission). In the emergency room  she was found with abnormal urine (although improved pyuria from last week) and slightly abnormal renal functions. She was also found to have BG of 58 and received 1 amp of dextrose. She was resumed on IV rocephin, getting IV fluids and requested to be admitted for failure to thrive./recurrent UTI and possible palliative care discussions.    Hospital Course:   1.Metabolic encephalopathy:Present on admission secondary to UTI  versus dehydration versus medications. Likely related to dehydration from decreased oral intake in setting of end of life/failure to thrive. At baseline on day of discharge.  Chart review indicates urine cultures from the most recent admission at [facility] (after patient received Keflex at SNF)showed nonsignificant bacterial colony-forming units. Urinalysis with less bacteria. CT head without acute abnormality. Reportedly decreased po intake of late. She remained afebrile with no leukocytosis and non-toxic appearing. Of note, spoke with daughter who states patients baseline is wheelchair bound due to painful hip, but patient can transfer self from bed to chair and chair to commode. She rolls herself around the assisted living facility, participates in bingo, manicures, Control and instrumentation engineer. Is able to make wants and needs known. received IV fluids and became more alert, interactive. Evaluated by Palliative care and patient being discharged back to [facility] with hospice. No escalation of care.   2.Failure to thrive/poor oral intake/dehydration/hypoglycemia: Upon last presentation patient had elevated creatinine at 1.45 which improved to 1.1 at the time of discharge. On admission creatinine 1.28. She was also noted to be hypoglycemic on arrival and received 1 amp of D50 for blood glucose of 58. IV fluid of D5NS started. Serum glucose 131 on day of discharge. She also ate doughnuts this am. Evaluated by speech who recommend prureed with crushed pills. Given she is at end of life and Hospice care, recommend offering her what she wants and can tolerate.   3.Chronic hypoxic respiratory failure: oxygen at facility 2 L nasal cannula. Chest x-ray today clear with no infiltrates.oxygen saturation level greater than 90% on 2L.  4.? Atrial fibrillation with left bundle branch block:Patient's EKG tracing from May 8,9 and May 18 suggestive of irregular rhythm with intraventricular conduction delay/LBBB. Not a  candidate for anticoagulation due to extensive brusing. Rate controlled.  5.Advanced dementia/chronic debility/multiple pressure ulcers: Continue skin care/barrier cream.  6.Chronic anemia: Stable  7.GERD: on famotidine  8. Hypokalemia: resolved this amrecieved replacement in ED  DVT prophylaxis:Ted Hose. Lovenox deferred due to extensive bruising  Procedures:    Consultations:  Palliative  Discharge Exam: Vitals:   05/20/19 0519 05/20/19 1140  BP: 131/84   Pulse: 61   Resp: 16   Temp: 98.1 F (36.7 C)   SpO2: 91% 93%    General: awake alert oriented to self and place. Smiling eating doughnuts. In no acute distress Cardiovascular: irregularly irregular no mgr no LE edema Respiratory: normal effort BS slightly coarse. No wheeze, no crackles  Discharge Instructions   Discharge Instructions    Diet - low sodium heart healthy   Complete by:  As directed    Discharge instructions   Complete by:  As directed    Take medications as prescribed. Eat and drink whatever desire with caution. Recommend soft easy to chew food. Doughnuts are encouraged.  She prefers white powdered doughnuts   Increase activity slowly   Complete by:  As directed      Allergies as of 05/20/2019   No Known Allergies     Medication List    STOP taking these medications   cephALEXin 250 MG capsule Commonly known as:  KEFLEX   sertraline 25 MG tablet Commonly known as:  ZOLOFT   sulfamethoxazole-trimethoprim 800-160 MG tablet Commonly known as:  BACTRIM DS     TAKE these medications   acidophilus Caps capsule Take 1 capsule by mouth 2 (two) times a day.   eucerin cream Apply 1 application topically See admin instructions. Apply to BOTH lower legs 2 times a day   famotidine 20 MG tablet Commonly known as:  PEPCID Take 20 mg by mouth at bedtime.   folic acid 1 MG tablet Commonly known as:  FOLVITE Take 1 mg by mouth daily.   loratadine 10 MG tablet Commonly  known as:  CLARITIN Take 10 mg by mouth daily before breakfast.   MULTIVITAMIN ADULT PO Take 1 capsule by mouth daily.   ondansetron 8 MG tablet Commonly known as:  ZOFRAN Take 8 mg by mouth 3 (three) times daily as needed for nausea or vomiting.   oxyCODONE-acetaminophen 5-325 MG tablet Commonly known as:  PERCOCET/ROXICET Take 1 tablet by mouth 3 (three) times daily.   OXYGEN Inhale 2 L into the lungs continuous.   protein supplement Powd Take 1 Scoop by mouth 2 (two) times a day.   senna-docusate 8.6-50 MG tablet Commonly known as:  Senokot-S Take 1 tablet by mouth daily.      No Known Allergies    The results of significant diagnostics from this hospitalization (including imaging, microbiology, ancillary and laboratory) are listed below for reference.    Significant Diagnostic Studies: Ct Head Wo Contrast  Result Date: 05/18/2019 CLINICAL DATA:  Altered mental status EXAM: CT HEAD WITHOUT CONTRAST TECHNIQUE: Contiguous axial images were obtained from the base of the skull through the vertex without intravenous contrast. COMPARISON:  CT head 05/15/2019 FINDINGS: Brain: Moderate atrophy. Microvascular ischemic change in the white matter bilaterally is stable. No acute infarct, hemorrhage, or mass Vascular: Negative for hyperdense vessel Skull: Negative Sinuses/Orbits: Polypoid mass left nasopharynx unchanged measuring 18 x 10 mm.  Bilateral cataract surgery. Other: None IMPRESSION: Atrophy and chronic microvascular ischemic change. No acute intracranial abnormality 18 x 10 mm polypoid lesion left nasopharynx unchanged. Recommend direct visualization. Electronically Signed   By: Franchot Gallo M.D.   On: 05/18/2019 13:53   Ct Head Wo Contrast  Result Date: 05/15/2019 CLINICAL DATA:  Altered mental status.  Recently diagnosed with UTI. EXAM: CT HEAD WITHOUT CONTRAST TECHNIQUE: Contiguous axial images were obtained from the base of the skull through the vertex without intravenous  contrast. COMPARISON:  CT head dated May 07, 2017. FINDINGS: Brain: No evidence of acute infarction, hemorrhage, hydrocephalus, extra-axial collection or mass lesion/mass effect. Stable atrophy and mild chronic microvascular ischemic changes. Vascular: Calcified atherosclerosis at the skullbase. No hyperdense vessel. Skull: Normal. Negative for fracture or focal lesion. Sinuses/Orbits: No acute finding. Slowly enlarging soft tissue mass or polyp in the left nasopharynx, now measuring 2.1 x 1.3 cm, previously 1.6 x 0.9 cm. Other: None. IMPRESSION: 1.  No acute intracranial abnormality. 2. Slowly enlarging 2.1 x 1.3 cm soft tissue mass or polyp in the left nasopharynx. Correlate with direct visualization. Electronically Signed   By: Titus Dubin M.D.   On: 05/15/2019 15:57   Dg Chest Port 1 View  Result Date: 05/18/2019 CLINICAL DATA:  Weakness, altered mental status. EXAM: PORTABLE CHEST 1 VIEW COMPARISON:  Radiograph of May 15, 2019. FINDINGS: The heart size and mediastinal contours are within normal limits. Both lungs are clear. The visualized skeletal structures are unremarkable. IMPRESSION: No active disease. Electronically Signed   By: Marijo Conception M.D.   On: 05/18/2019 12:54   Dg Chest Port 1 View  Result Date: 05/15/2019 CLINICAL DATA:  Altered mental status EXAM: PORTABLE CHEST 1 VIEW COMPARISON:  05/07/2017 FINDINGS: The heart size and mediastinal contours are within normal limits. Both lungs are clear. Apically scarring calcification left apex unchanged. The visualized skeletal structures are unremarkable. IMPRESSION: No active disease. Electronically Signed   By: Franchot Gallo M.D.   On: 05/15/2019 15:23    Microbiology: Recent Results (from the past 240 hour(s))  SARS Coronavirus 2 (CEPHEID - Performed in [place] hospital lab), Hosp Order     Status: None   Collection Time: 05/15/19  5:24 PM  Result Value Ref Range Status   SARS Coronavirus 2 NEGATIVE NEGATIVE Final    Comment:  (NOTE) If result is NEGATIVE SARS-CoV-2 target nucleic acids are NOT DETECTED. The SARS-CoV-2 RNA is generally detectable in upper and lower  respiratory specimens during the acute phase of infection. The lowest  concentration of SARS-CoV-2 viral copies this assay can detect is 250  copies / mL. A negative result does not preclude SARS-CoV-2 infection  and should not be used as the sole basis for treatment or other  patient management decisions.  A negative result may occur with  improper specimen collection / handling, submission of specimen other  than nasopharyngeal swab, presence of viral mutation(s) within the  areas targeted by this assay, and inadequate number of viral copies  (<250 copies / mL). A negative result must be combined with clinical  observations, patient history, and epidemiological information. If result is POSITIVE SARS-CoV-2 target nucleic acids are DETECTED. The SARS-CoV-2 RNA is generally detectable in upper and lower  respiratory specimens dur ing the acute phase of infection.  Positive  results are indicative of active infection with SARS-CoV-2.  Clinical  correlation with patient history and other diagnostic information is  necessary to determine patient infection status.  Positive results do  not rule out bacterial infection or co-infection with other viruses. If result is PRESUMPTIVE POSTIVE SARS-CoV-2 nucleic acids MAY BE PRESENT.   A presumptive positive result was obtained on the submitted specimen  and confirmed on repeat testing.  While 2019 novel coronavirus  (SARS-CoV-2) nucleic acids may be present in the submitted sample  additional confirmatory testing may be necessary for epidemiological  and / or clinical management purposes  to differentiate between  SARS-CoV-2 and other Sarbecovirus currently known to infect humans.  If clinically indicated additional testing with an alternate test  methodology [phone]) is advised. The SARS-CoV-2 RNA is  generally  detectable in upper and lower respiratory sp ecimens during the acute  phase of infection. The expected result is Negative. Fact Sheet for Patients:  StrictlyIdeas.no Fact Sheet for Healthcare Providers: BankingDealers.co.za This test is not yet approved or cleared by the [place] FDA and has been authorized for detection and/or diagnosis of SARS-CoV-2 by FDA under an Emergency Use Authorization (EUA).  This EUA will remain in effect (meaning this test can be used) for the duration of the COVID-19 declaration under Section 564(b)(1) of the Act, 21 U.S.C. section 360bbb-3(b)(1), unless the authorization is terminated or revoked sooner. Performed at [facility], [place] [address], [place], [place] [address]   Culture, Urine     Status: Abnormal   Collection Time: 05/16/19  7:09 AM  Result Value Ref Range Status   Specimen Description   Final    URINE, RANDOM Performed at [facility] [place] [address] [place], [place] [address]    Special Requests   Final    plz culture 5/15 urine Performed at [facility] [address], [place], [place] [address]    Culture (A)  Final    50,000 COLONIES/mL GRANULICATELLA ADIACENS Standardized susceptibility testing for this organism is not available. Performed at [facility], [place] [address], [place], [place] [address]    Report Status 05/18/2019 FINAL  Final  Culture, blood (routine x 2)     Status: None (Preliminary result)   Collection Time: 05/18/19  1:40 PM  Result Value Ref Range Status   Specimen Description BLOOD RIGHT FOREARM  Final   Special Requests   Final    BOTTLES DRAWN AEROBIC AND ANAEROBIC Blood Culture results may not be optimal due to an inadequate volume of blood received in culture bottles   Culture   Final    NO GROWTH 1 DAY Performed at [facility], [place] [address], [place], [place]  [address]    Report Status PENDING  Incomplete  SARS Coronavirus 2 (CEPHEID - Performed in [place] hospital lab), Hosp Order     Status: None   Collection Time: 05/18/19  1:55 PM  Result Value Ref Range Status   SARS Coronavirus 2 NEGATIVE NEGATIVE Final    Comment: (NOTE) If result is NEGATIVE SARS-CoV-2 target nucleic acids are NOT DETECTED. The SARS-CoV-2 RNA is generally detectable in upper and lower  respiratory specimens during the acute phase of infection. The lowest  concentration of SARS-CoV-2 viral copies this assay can detect is 250  copies / mL. A negative result does not preclude SARS-CoV-2 infection  and should not be used as the sole basis for treatment or other  patient management decisions.  A negative result may occur with  improper specimen collection / handling, submission of specimen other  than nasopharyngeal swab, presence of viral mutation(s) within the  areas targeted by  this assay, and inadequate number of viral copies  (<250 copies / mL). A negative result must be combined with clinical  observations, patient history, and epidemiological information. If result is POSITIVE SARS-CoV-2 target nucleic acids are DETECTED. The SARS-CoV-2 RNA is generally detectable in upper and lower  respiratory specimens dur ing the acute phase of infection.  Positive  results are indicative of active infection with SARS-CoV-2.  Clinical  correlation with patient history and other diagnostic information is  necessary to determine patient infection status.  Positive results do  not rule out bacterial infection or co-infection with other viruses. If result is PRESUMPTIVE POSTIVE SARS-CoV-2 nucleic acids MAY BE PRESENT.   A presumptive positive result was obtained on the submitted specimen  and confirmed on repeat testing.  While 2019 novel coronavirus  (SARS-CoV-2) nucleic acids may be present in the submitted sample  additional confirmatory testing may be necessary for  epidemiological  and / or clinical management purposes  to differentiate between  SARS-CoV-2 and other Sarbecovirus currently known to infect humans.  If clinically indicated additional testing with an alternate test  methodology [phone]) is advised. The SARS-CoV-2 RNA is generally  detectable in upper and lower respiratory sp ecimens during the acute  phase of infection. The expected result is Negative. Fact Sheet for Patients:  StrictlyIdeas.no Fact Sheet for Healthcare Providers: BankingDealers.co.za This test is not yet approved or cleared by the [place] FDA and has been authorized for detection and/or diagnosis of SARS-CoV-2 by FDA under an Emergency Use Authorization (EUA).  This EUA will remain in effect (meaning this test can be used) for the duration of the COVID-19 declaration under Section 564(b)(1) of the Act, 21 U.S.C. section 360bbb-3(b)(1), unless the authorization is terminated or revoked sooner. Performed at [facility], [place] [address], [place], [place] [address]   Culture, blood (routine x 2)     Status: None (Preliminary result)   Collection Time: 05/18/19  2:55 PM  Result Value Ref Range Status   Specimen Description BLOOD LEFT ANTECUBITAL  Final   Special Requests   Final    BOTTLES DRAWN AEROBIC ONLY Blood Culture results may not be optimal due to an inadequate volume of blood received in culture bottles   Culture   Final    NO GROWTH 1 DAY Performed at [facility], [place] [address], [place], [place] [address]    Report Status PENDING  Incomplete  MRSA PCR Screening     Status: None   Collection Time: 05/19/19  2:25 PM  Result Value Ref Range Status   MRSA by PCR NEGATIVE NEGATIVE Final    Comment:        The GeneXpert MRSA Assay (FDA approved for NASAL specimens only), is one component of a comprehensive MRSA colonization surveillance program. It is not intended to diagnose  MRSA infection nor to guide or monitor treatment for MRSA infections. Performed at [facility], [place] [address], [place], [place] [address]      Labs: Basic Metabolic Panel: Recent Labs  Lab 05/15/19 1351 05/16/19 0510 05/18/19 1224 05/19/19 0249  NA 139 141 141 143  K 3.4* 3.4* 4.3 3.7  CL 106 110 108 110  CO2 24 23 20* 25  GLUCOSE 112* 79 80 131*  BUN 44* 31* 24* 15  CREATININE 1.45* 1.11* 1.28* 1.02*  CALCIUM 10.9* 10.5* 10.4* 9.7   Liver Function Tests: Recent Labs  Lab 05/15/19 1351 05/18/19 1224  AST 19 23  ALT  9 9  ALKPHOS 72 68  BILITOT 0.8 0.8  PROT 7.5 7.2  ALBUMIN 3.9 3.4*   No results for input(s): LIPASE, AMYLASE in the last 168 hours. No results for input(s): AMMONIA in the last 168 hours. CBC: Recent Labs  Lab 05/15/19 1351 05/16/19 0510 05/18/19 1224  WBC 8.0 5.6 4.1  NEUTROABS 6.0  --  1.6*  HGB 13.8 11.4* 13.5  HCT 42.9 36.6 43.3  MCV 93.5 96.1 93.9  PLT 206 143* 132*   Cardiac Enzymes: Recent Labs  Lab 05/18/19 1224  TROPONINI 0.03*   BNP: BNP (last 3 results) No results for input(s): BNP in the last 8760 hours.  ProBNP (last 3 results) No results for input(s): PROBNP in the last 8760 hours.  CBG: Recent Labs  Lab 05/18/19 1154 05/18/19 1244 05/18/19 1915  GLUCAP 58* 136* 75       Signed:  Radene Gunning NP.  [facility] 05/20/2019, 12:37 PM

## 2019-05-20 NOTE — NC FL2 (Signed)
[place] LEVEL OF CARE SCREENING TOOL     IDENTIFICATION  Patient Name: [name] Birthdate: 1918-06-17 Sex: female Admission Date (Current Location): 05/18/2019  [place] and [place] Number:  [name] [id number] [place] and Address:  [name], [name], [name], [name]      Provider Number: [id number]  Attending Physician Name and Address:  Geradine Girt, DO  Relative Name and Phone Number:  [name]; daughter; [phone]    Current Level of Care: Hospital Recommended Level of Care: [place] Prior Approval Number:    Date Approved/Denied:   PASRR Number:    Discharge Plan: Other (Comment)([facility])    Current Diagnoses: Patient Active Problem List   Diagnosis Date Noted  . Hypoglycemia 05/19/2019  . Metabolic encephalopathy 89/37/3428  . Acute metabolic encephalopathy 76/81/1572  . Failure to thrive (0-17) 05/18/2019  . Pressure injury of skin 05/16/2019  . AKI (acute kidney injury) ([place]) 05/15/2019  . DNR (do not resuscitate) 01/25/2015  . Weakness generalized 01/25/2015  . Palliative care encounter 01/25/2015  . UTI (urinary tract infection) 01/24/2015  . Hypoxia 01/23/2015  . Elevated troponin 01/23/2015  . CKD (chronic kidney disease) stage 3, GFR 30-59 ml/min (HCC) 01/23/2015  . Chronic anemia 01/23/2015  . SOB (shortness of breath) 01/23/2015  . Cough     Orientation RESPIRATION BLADDER Height & Weight     Self, Place  O2(2L nasal canula) Incontinent Weight: 119 lb 14.9 oz (54.4 kg) Height:  5\' 2"  (157.5 cm)  BEHAVIORAL SYMPTOMS/MOOD NEUROLOGICAL BOWEL NUTRITION STATUS      Continent Diet(regular diet with soft foods; thin liquids)  AMBULATORY STATUS COMMUNICATION OF NEEDS Skin   Extensive Assist Verbally PU Stage and Appropriate Care, Skin abrasions, Bruising(skin tear on left buttocks with foam dressing; dry, black toes on right foot; generalized  ecchymosis; bruising on lower extremities; blood blister on left toes with foam dressing) PU Stage 1 Dressing: (PU on mid back and buttocks with foam dressings)                     Personal Care Assistance Level of Assistance  Feeding, Dressing, Bathing Bathing Assistance: Maximum assistance Feeding assistance: Limited assistance Dressing Assistance: Maximum assistance     Functional Limitations Info  Sight, Hearing, Speech Sight Info: Adequate Hearing Info: Adequate Speech Info: Impaired(missing dentation)    SPECIAL CARE FACTORS FREQUENCY                       Contractures Contractures Info: Not present    Additional Factors Info  Code Status, Allergies, Psychotropic Code Status Info: DNR Allergies Info: No Known Allergies Psychotropic Info: mirtazapine (REMERON) tablet 7.5 mg daily at bedtime         Current Medications (05/20/2019):  This is the current hospital active medication list Current Facility-Administered Medications  Medication Dose Route Frequency Provider Last Rate Last Dose  . acetaminophen (TYLENOL) tablet 650 mg  650 mg Oral Q6H PRN Guilford Shi, MD       Or  . acetaminophen (TYLENOL) suppository 650 mg  650 mg Rectal Q6H PRN Guilford Shi, MD      . acidophilus (RISAQUAD) capsule 1 capsule  1 capsule Oral BID Guilford Shi, MD   1 capsule at 05/20/19 1036  . cefTRIAXone (ROCEPHIN) 1 g in sodium chloride 0.9 % 100 mL IVPB  1 g Intravenous Q24H Guilford Shi, MD 200 mL/hr at 05/19/19 1238  1 g at 05/19/19 1238  . famotidine (PEPCID) tablet 20 mg  20 mg Oral QHS Guilford Shi, MD   20 mg at 40/81/44 8185  . folic acid (FOLVITE) tablet 1 mg  1 mg Oral Daily Guilford Shi, MD   1 mg at 05/20/19 1037  . hydrALAZINE (APRESOLINE) injection 5 mg  5 mg Intravenous Q4H PRN Black, Lezlie Octave, NP      . hydrocerin (EUCERIN) cream 1 application  1 application Topical BID Guilford Shi, MD   1 application at 63/14/97 1039  .  mirtazapine (REMERON) tablet 7.5 mg  7.5 mg Oral QHS Guilford Shi, MD   7.5 mg at 05/19/19 2120  . multivitamin with minerals tablet 1 tablet  1 tablet Oral Daily Guilford Shi, MD   1 tablet at 05/20/19 1037  . ondansetron (ZOFRAN) tablet 8 mg  8 mg Oral TID PRN Guilford Shi, MD      . protein supplement (RESOURCE BENEPROTEIN) powder 6 g  1 Scoop Oral BID Guilford Shi, MD   6 g at 05/20/19 1039  . senna-docusate (Senokot-S) tablet 1 tablet  1 tablet Oral Daily Guilford Shi, MD   1 tablet at 05/20/19 1037  . sodium chloride flush (NS) 0.9 % injection 10-40 mL  10-40 mL Intracatheter Q12H Vann, Jessica U, DO   10 mL at 05/20/19 1040  . sodium chloride flush (NS) 0.9 % injection 10-40 mL  10-40 mL Intracatheter PRN Geradine Girt, DO         Discharge Medications: Please see discharge summary for a list of discharge medications.  Relevant Imaging Results:  Relevant Lab Results:   Additional Information SS#[phone]; hospice services ordered through [place] [facility] [employer], [place]

## 2019-05-20 NOTE — Progress Notes (Signed)
Daily Progress Note   Patient Name: [name]       Date: 05/20/2019 DOB: 08/08/1918  Age: 85 y.o. MRN#: [id number] Attending Physician: Geradine Girt, DO Primary Care Physician: Patient, No Pcp Per Admit Date: 05/18/2019  Reason for Consultation/Follow-up: Establishing goals of care  Subjective: Patient awake, alert, and oriented to self. She expresses she feels tired and is about ready for a nap. Continues to have poor po intake. States she doesn't have much of an appetite. Denies pain or shortness of breath.   She asked if I was able to speak with her daughter, [name]. Explained that I had spoken with her on yesterday and we could call her now to check in. Daughter provided with updates. She is aware that her mother continues to be weak and has a poor appetite. She verbalized understanding and expressed she was ok with this and as expressed yesterday she is not pressing the issue of her not eating or drinking given her age and their goals of allowing her to be comfortable and not pressure her with extensive medical interventions.   Reviewed our previous [place] discussion. Daughter verbalizes goals remain in place that their wishes are for patient to return to [place] with hospice support. Family remains hopeful patient will do a little better once back in her own environment but if not as long as she is comfortable and not suffering. Support given. Daughter expressed staff at [facility] is like family to her mother and that she has already spoken to the director who informed her that patient should be ok to return back to their facility with hospice.   The above conversation was completed via telephone due to the visitor restrictions during the COVID-19 pandemic. Thorough chart review and  discussion with necessary members of the care team was completed as part of assessment.   Length of Stay: 1  Current Medications: Scheduled Meds:   acidophilus  1 capsule Oral BID   famotidine  20 mg Oral QHS   folic acid  1 mg Oral Daily   hydrocerin  1 application Topical BID   mirtazapine  7.5 mg Oral QHS   multivitamin with minerals  1 tablet Oral Daily   pneumococcal 23 valent vaccine  0.5 mL Intramuscular Tomorrow-1000   protein supplement  1 Scoop Oral BID  senna-docusate  1 tablet Oral Daily   sodium chloride flush  10-40 mL Intracatheter Q12H    Continuous Infusions:  cefTRIAXone (ROCEPHIN)  IV 1 g (05/19/19 1238)   dextrose 5 % and 0.9% NaCl 50 mL/hr at 05/20/19 0300    PRN Meds: acetaminophen **OR** acetaminophen, hydrALAZINE, ondansetron, sodium chloride flush  Physical Exam         General: thin, frail, chronically ill appearing female Cardiovascular: IRRR, no edema, scattered bruising, lower extremity cool to touch Pulmonary: diminished in bases Skin: scattered bruising, thin, multiple reported areas of skin breakdown, dressings intact Neurological: Awake, alert, able to follow commands. Weakness, confusion, hx of dementia   Vital Signs: BP 131/84 (BP Location: Right Arm)    Pulse 61    Temp 98.1 F (36.7 C) (Oral)    Resp 16    Ht 5\' 2"  (1.575 m)    Wt 54.4 kg    SpO2 91%    BMI 21.94 kg/m  SpO2: SpO2: 91 % O2 Device: O2 Device: Nasal Cannula O2 Flow Rate: O2 Flow Rate (L/min): 3 L/min  Intake/output summary:   Intake/Output Summary (Last 24 hours) at 05/20/2019 1031 Last data filed at 05/20/2019 0845 Gross per 24 hour  Intake 1875.29 ml  Output 600 ml  Net 1275.29 ml   LBM: Last BM Date: (uta) Baseline Weight: Weight: 54.4 kg Most recent weight: Weight: 54.4 kg       Palliative Assessment/Data: PPS Chair/Bed bound, poor po intake    Flowsheet Rows     Most Recent Value  Intake Tab  Referral Department  Hospitalist  Unit at Time  of Referral  ER  Date Notified  05/19/19  Palliative Care Type  Return patient Palliative Care  Reason for referral  Clarify Goals of Care  Date of Admission  05/18/19  # of days IP prior to Palliative referral  1  Clinical Assessment  Psychosocial & Spiritual Assessment  Palliative Care Outcomes      Patient Active Problem List   Diagnosis Date Noted   Hypoglycemia 29/51/8841   Metabolic encephalopathy 66/06/3015   Acute metabolic encephalopathy 01/08/3234   Failure to thrive (0-17) 05/18/2019   Pressure injury of skin 05/16/2019   AKI (acute kidney injury) ([place]) 05/15/2019   DNR (do not resuscitate) 01/25/2015   Weakness generalized 01/25/2015   Palliative care encounter 01/25/2015   UTI (urinary tract infection) 01/24/2015   Hypoxia 01/23/2015   Elevated troponin 01/23/2015   CKD (chronic kidney disease) stage 3, GFR 30-59 ml/min (HCC) 01/23/2015   Chronic anemia 01/23/2015   SOB (shortness of breath) 01/23/2015   Cough     Palliative Care Assessment & Plan   Patient Profile: Palliative Care consult requested for this 85 y.o. female with multiple medical problems including arthritis, dementia, chronic hypoxic respiratory failure (2L home use), recurrent UTIs, fibromyalgia, GERD, hypertension, colon cancer, and ovarian cancer. Her daughter reports she is wheelchair bound. She was admitted from [facility] ALF with lethargy, altered mental status, and possible UTI.    Recommendations/Plan:  DNR/DNI  Continue to treat  Family's wishes are for patient to return to [facility] ALF with outpatient hospice support once stable  PMT will continue to support and follow as needed   Goals of Care and Additional Recommendations:  Limitations on Scope of Treatment: Continue to treat with no escalation of care   Code Status:    Code Status Orders  (From admission, onward)         Start  Ordered   05/18/19 1805  Do not attempt resuscitation (DNR)   Continuous    Question Answer Comment  In the event of cardiac or respiratory ARREST Do not call a code blue   In the event of cardiac or respiratory ARREST Do not perform Intubation, CPR, defibrillation or ACLS   In the event of cardiac or respiratory ARREST Use medication by any route, position, wound care, and other measures to relive pain and suffering. May use oxygen, suction and manual treatment of airway obstruction as needed for comfort.      05/18/19 1807        Code Status History    Date Active Date Inactive Code Status Order ID Comments User Context   05/15/2019 2005 05/16/2019 2301 DNR [id number]  Barb Merino, MD Inpatient   01/24/2015 0019 01/26/2015 1856 DNR [id number]  Rise Patience, MD Inpatient    Advance Directive Documentation     Most Recent Value  Type of Advance Directive  Out of facility DNR (pink MOST or yellow form)  Pre-existing out of facility DNR order (yellow form or pink MOST form)  Yellow form placed in chart (order not valid for inpatient use)  "MOST" Form in Place?  --      Prognosis:   < 6 months in the setting of advanced dementia, recurrent UTIs, wheelchair bound, multiple pressure ulcerations, chronic debility, atrial fibrillation, chronic hypoxic respiratory failure, dehydration, hypoglycemia, encephalopathy, advanced age, and hypertension.   Discharge Planning:  Per family's request, goal is for patient to return to [facility] with outpatient hospice   Thank you for allowing the Palliative Medicine Team to assist in the care of this patient.  Total Time: 45 min.   Greater than 50%  of this time was spent counseling and coordinating care related to the above assessment and plan.  Alda Lea, AGPCNP-BC Palliative Medicine Team  Phone: [phone] Pager: [phone] Amion: Bjorn Pippin    Please contact Palliative Medicine Team phone at [phone] for questions and concerns.

## 2019-05-20 NOTE — Progress Notes (Signed)
Hydrologist [facility])  Hospital Liaison: RN note.  Notified by Westley Hummer, CSW of patient/family request for [facility] services at [place] after discharge. Chart and patient information under review by [facility] physician. Hospice eligibility pending at this time.     Writer spoke with daughter by phone to initiate education related to hospice philosophy, services and team approach to care.  Family verbalized understanding of information given. Per discussion, plan is for discharge to home by PTAR     Please send signed and completed DNR form home with patient/family. Patient will need prescriptions for discharge comfort medications.     DME needs have been discussed, patient currently has the following equipment in the home: walker and W/C.  Patient/family requests the following DME for delivery to the home: none.     [facility] aware of the above. Please notify [facility] when patient is ready to leave the unit at discharge. (Call [phone] or [phone] after 5pm.) [facility] information and contact numbers given to family.     Please call with any hospice related questions.     Thank you for this referral.     Farrel Gordon, RN, CCM  [place] (listed on AMION under Hospice and [place] [facility] [place])  [phone]

## 2019-05-20 NOTE — Evaluation (Addendum)
Physical Therapy Evaluation & Discharge Patient Details Name: [name] MRN: [id number] DOB: 1918/10/01 Today's Date: 05/20/2019   History of Present Illness  Pt is a 85 y.o. female admitted from ALF on 05/18/19 with lethargy/AMS and recently treated UTI; pt also dehydrated. PMH includes dementia, arthritis, chronic hypoxic respiratory failure (on 2L O2 baseline), chronic debility (wheelchair bound).    Clinical Impression  Patient evaluated by Physical Therapy with no further acute PT needs identified. PTA, pt from ALF requiring assist for wheelchair-level transfers and ADLs. Today, pt able to perform squat pivot transfers with maxA. Pt pleasantly confused throughout session, able to communicate needs well. All education has been completed and the patient has no further questions. Acute PT signing off. Thank you for this referral.    Follow Up Recommendations SNF(return to ALF with hospice)    Equipment Recommendations  None recommended by PT    Recommendations for Other Services       Precautions / Restrictions Precautions Precautions: Fall Restrictions Weight Bearing Restrictions: No      Mobility  Bed Mobility Overal bed mobility: Needs Assistance Bed Mobility: Supine to Sit;Sit to Supine     Supine to sit: Mod assist;HOB elevated Sit to supine: Mod assist   General bed mobility comments: ModA to assist trunk elevation and scoot hips to EOB; modA for return to supine and repositioning in bed  Transfers Overall transfer level: Needs assistance   Transfers: Squat Pivot Transfers     Squat pivot transfers: Max assist     General transfer comment: MaxA for squat pivot from bed<>BSC, pt able to minimally assist with UE support; limited by generalized weakness and BLE contractures  Ambulation/Gait             General Gait Details: NT - does not ambulate at baseline  Stairs            Wheelchair Mobility    Modified Rankin (Stroke Patients Only)        Balance Overall balance assessment: Needs assistance   Sitting balance-Leahy Scale: Fair       Standing balance-Leahy Scale: Zero                               Pertinent Vitals/Pain Pain Assessment: Faces Faces Pain Scale: Hurts a little bit Pain Location: Generalized Pain Descriptors / Indicators: Discomfort Pain Intervention(s): Monitored during session    Home Living Family/patient expects to be discharged to:: Skilled nursing facility                 Additional Comments: Per chart, pt from [facility]     Prior Function Level of Independence: Needs assistance   Gait / Transfers Assistance Needed: Pt poor historian. Per chart, assist for transfers to/from wheelchair, does not ambulate           Hand Dominance        Extremity/Trunk Assessment   Upper Extremity Assessment Upper Extremity Assessment: Generalized weakness    Lower Extremity Assessment Lower Extremity Assessment: Generalized weakness(BLE swollen with contractures)       Communication   Communication: HOH  Cognition Arousal/Alertness: Awake/alert Behavior During Therapy: WFL for tasks assessed/performed Overall Cognitive Status: History of cognitive impairments - at baseline                                 General Comments: Pt poor  historian, able to state name. Intermittent appropriate conversation; following some simple commands appropriately. Pleasantly confused      General Comments      Exercises     Assessment/Plan    PT Assessment All further PT needs can be met in the next venue of care  PT Problem List Decreased strength;Decreased activity tolerance;Decreased balance;Decreased mobility;Decreased cognition;Decreased knowledge of use of DME;Decreased skin integrity       PT Treatment Interventions      PT Goals (Current goals can be found in the Care Plan section)  Acute Rehab PT Goals PT Goal Formulation: All assessment and  education complete, DC therapy    Frequency     Barriers to discharge        Co-evaluation               AM-PAC PT "6 Clicks" Mobility  Outcome Measure Help needed turning from your back to your side while in a flat bed without using bedrails?: A Lot Help needed moving from lying on your back to sitting on the side of a flat bed without using bedrails?: A Lot Help needed moving to and from a bed to a chair (including a wheelchair)?: A Lot Help needed standing up from a chair using your arms (e.g., wheelchair or bedside chair)?: Total Help needed to walk in hospital room?: Total Help needed climbing 3-5 steps with a railing? : Total 6 Click Score: 9    End of Session Equipment Utilized During Treatment: Gait belt Activity Tolerance: Patient tolerated treatment well Patient left: in bed;with call bell/phone within reach;with bed alarm set Nurse Communication: Mobility status PT Visit Diagnosis: Other abnormalities of gait and mobility (R26.89);Muscle weakness (generalized) (M62.81)    Time: 1416-1440 PT Time Calculation (min) (ACUTE ONLY): 24 min   Charges:   PT Evaluation $PT Eval Moderate Complexity: 1 Mod PT Treatments $Therapeutic Activity: 8-22 mins   Mabeline Caras, PT, DPT Acute Rehabilitation Services  Pager [phone] Office [place] 05/20/2019, 3:26 PM

## 2019-05-20 NOTE — TOC Initial Note (Signed)
Transition of Care [facility]) - Initial/Assessment Note    Patient Details  Name: [name] MRN: [id number] Date of Birth: [birth year]  Transition of Care [facility]) CM/SW Contact:    [name], [place] Phone Number: 05/20/2019, 2:19 PM  Clinical Narrative:                 CSW spoke with pt daughter [name] on telephone [phone]). Introduced self, role, reason for call. Pt daughter confirmed pt has been living at [facility] for around 4 years. She is in agreement for hospice services at ALF, but states she has some worry that pt does have "a diagnosis that would make her eligible." CSW explained that if pt was not hospice eligible that they may be able to provide palliative visits in addition to the care a [facility].   Referral made to [facility], sent documents required by ALF for pt return.   Will need confirmation from facility prior to return. NP and MD aware.   Expected Discharge Plan: Assisted Living(with hospice) Barriers to Discharge: Continued Medical Work up   Patient Goals and CMS Choice Patient states their goals for this hospitalization and ongoing recovery are:: for her to return to [email or web address].gov Compare Post Acute Care list provided to:: (n/a pt resident at [facility]) Choice offered to / list presented to : Adult Children  Expected Discharge Plan and Services Expected Discharge Plan: Assisted Living(with hospice) In-house Referral: Clinical Social Work, Hospice / Palliative Care Discharge Planning Services: NA Post Acute Care Choice: Hospice, Resumption of Svcs/PTA Provider Living arrangements for the past 2 months: [place] Expected Discharge Date: 05/20/19                         [facility] Arranged: NA          Prior Living Arrangements/Services Living arrangements for the past 2 months: [place] Lives with:: Facility Resident Patient language and need for interpreter reviewed:: Yes(no needs) Do you feel safe  going back to the place where you live?: Yes      Need for Family Participation in Patient Care: Yes (Comment)(support; decision making) Care giver support system in place?: Yes (comment)(adult daughter; facility) Current home services: DME Criminal Activity/Legal Involvement Pertinent to Current Situation/Hospitalization: No - Comment as needed  Activities of Daily Living Home Assistive Devices/Equipment: Other (Comment) ADL Screening (condition at time of admission) Patient's cognitive ability adequate to safely complete daily activities?: No Is the patient deaf or have difficulty hearing?: No Does the patient have difficulty seeing, even when wearing glasses/contacts?: No Does the patient have difficulty concentrating, remembering, or making decisions?: Yes Patient able to express need for assistance with ADLs?: Yes Does the patient have difficulty dressing or bathing?: Yes Independently performs ADLs?: No Communication: Independent Dressing (OT): Needs assistance Is this a change from baseline?: Pre-admission baseline Grooming: Needs assistance Is this a change from baseline?: Pre-admission baseline Feeding: Needs assistance Is this a change from baseline?: Pre-admission baseline Bathing: Needs assistance Is this a change from baseline?: Pre-admission baseline Toileting: Needs assistance Is this a change from baseline?: Pre-admission baseline In/Out Bed: Dependent, Needs assistance Is this a change from baseline?: Pre-admission baseline Walks in Home: Dependent, Needs assistance Is this a change from baseline?: Pre-admission baseline Does the patient have difficulty walking or climbing stairs?: Yes Weakness of Legs: Both Weakness of Arms/Hands: Both  Permission Sought/Granted Permission sought to share information with : Facility Sport and exercise psychologist, Family Supports Permission granted to  share information with : No(pt not oriented)  Share Information with NAME: [name]  Permission granted to share info w AGENCY: [place]; [facility]  Permission granted to share info w Relationship: daughter  Permission granted to share info w Contact Information: [phone]  Emotional Assessment Appearance:: Other (Comment Required(spoke with pt daughter) Attitude/Demeanor/Rapport: Unable to Assess Affect (typically observed): Unable to Assess Orientation: : Oriented to Self, Oriented to Place, Fluctuating Orientation (Suspected and/or reported Sundowners) Alcohol / Substance Use: Not Applicable Psych Involvement: No (comment)  Admission diagnosis:  Dehydration [E86.0] Altered mental status, unspecified altered mental status type [R41.82] Patient Active Problem List   Diagnosis Date Noted  . Hypoglycemia 05/19/2019  . Metabolic encephalopathy 62/95/2841  . Acute metabolic encephalopathy 32/44/0102  . Failure to thrive (0-17) 05/18/2019  . Pressure injury of skin 05/16/2019  . AKI (acute kidney injury) ([place]) 05/15/2019  . DNR (do not resuscitate) 01/25/2015  . Weakness generalized 01/25/2015  . Palliative care encounter 01/25/2015  . UTI (urinary tract infection) 01/24/2015  . Hypoxia 01/23/2015  . Elevated troponin 01/23/2015  . CKD (chronic kidney disease) stage 3, GFR 30-59 ml/min (HCC) 01/23/2015  . Chronic anemia 01/23/2015  . SOB (shortness of breath) 01/23/2015  . Cough    PCP:  Patient, No Pcp Per Pharmacy:  No Pharmacies Listed    Social Determinants of Health (SDOH) Interventions    Readmission Risk Interventions No flowsheet data found.

## 2019-05-20 NOTE — Social Work (Signed)
HIPAA compliant message left for pt daughter at [phone]. Continue to await paperwork needed for pt to return to ALF; f/u with Abigail Butts at [place] [phone]).  Westley Hummer, MSW, [place] Work [phone]

## 2019-05-20 NOTE — Progress Notes (Signed)
Nutrition Brief Note  Chart reviewed. Reviewed palliative care notes; goal is to focus on comfort with no escalation of care. Plan to return to SNF with hospice support once medically stable. Per palliative care notes, pt daughter aware that pt is not eating or drinking and does not want to push this issue.  No further nutrition interventions warranted at this time.  Please re-consult as needed.   [place] Brigance A. Jimmye Norman, RD, LDN, [place] Registered Dietitian II Certified Diabetes Care and Education Specialist Pager: [phone] After hours Pager: [phone]

## 2019-05-20 NOTE — Progress Notes (Signed)
Contacted Physical Therapy Rondel Oh- PT will be evaluating her this evening after 1 pm.

## 2019-05-21 LAB — URINE CULTURE: Culture: NO GROWTH

## 2019-05-21 MED ORDER — OXYCODONE-ACETAMINOPHEN 5-325 MG PO TABS: 1.0000 | ORAL_TABLET | Freq: Three times a day (TID) | ORAL | 0 refills | Status: AC

## 2019-05-21 NOTE — Progress Notes (Signed)
Patient discharged to [facility]. Patient report phoned to staff. Patient belongings and AVS summary transported with patient via [place].  Patient  transported off unit by PTAR.

## 2019-05-21 NOTE — TOC Transition Note (Signed)
Transition of Care [facility]) - CM/SW Discharge Note   Patient Details  Name: [name] MRN: [id number] Date of Birth: 08-03-1918  [facility] of Care [facility]) CM/SW Contact:  Alexander Mt, [place] Phone Number: 05/21/2019, 1:42 PM   Clinical Narrative:    Pt returning to [place] with hospice services through [place]. ALF has all needed paperwork. DNR on chart signed and script also signed. Pt daughter contacted and aware of plan.   [facility] aware of pt discharge timing and destination.    Final next level of care: Assisted Living(with hospice) Barriers to Discharge: Barriers Resolved   Patient Goals and CMS Choice Patient states their goals for this hospitalization and ongoing recovery are:: for her to return to [email or web address] Compare Post Acute Care list provided to:: (n/a pt resident at [facility]) Choice offered to / list presented to : Adult Children  Discharge Placement              Patient chooses bed at: Other - please specify in the comment section below: Patient to be transferred to facility by: [place] Name of family member notified: pt daughter [name] via telephone Patient and family notified of of transfer: 05/21/19  Discharge Plan and Services In-house Referral: Clinical Social Work, Hospice / Palliative Care Discharge Planning Services: NA Post Acute Care Choice: Hospice, Resumption of Svcs/PTA Provider          DME Arranged: N/A         HH Arranged: NA HH Agency: Hospice and [place] Date [place]: 05/20/19 Time [place]: [id number] Representative spoke with at [place]: [place] (SDOH) Interventions     Readmission Risk Interventions No flowsheet data found.

## 2019-05-21 NOTE — TOC Progression Note (Signed)
Transition of Care [facility]) - Progression Note    Patient Details  Name: [name] MRN: [id number] Date of Birth: [birth year]  Transition of Care [facility]) CM/SW [place], [place] Phone Number: 05/21/2019, 9:29 AM  Clinical Narrative:    Call placed to [facility] ALF to confirm pt return. Appropriate staff members not currently available. Will f/u with call to ensure pt dc today.    Expected Discharge Plan: Assisted Living(with hospice) Barriers to Discharge: Continued Medical Work up  Expected Discharge Plan and Services Expected Discharge Plan: Assisted Living(with hospice) In-house Referral: Clinical Social Work, Hospice / Palliative Care Discharge Planning Services: NA Post Acute Care Choice: Hospice, Resumption of Svcs/PTA Provider Living arrangements for the past 2 months: [place] Expected Discharge Date: 05/21/19                         [facility] Arranged: NA           Social Determinants of Health (SDOH) Interventions    Readmission Risk Interventions No flowsheet data found.

## 2019-05-21 NOTE — Progress Notes (Addendum)
Physical Therapy Treatment & Discharge Patient Details Name: [name] MRN: [id number] DOB: 01-12-1918 Today's Date: 05/21/2019    History of Present Illness Pt is a 85 y.o. female admitted from ALF on 05/18/19 with lethargy/AMS and recently treated UTI; pt also dehydrated. PMH includes dementia, arthritis, chronic hypoxic respiratory failure (on 2L O2 baseline), chronic debility (wheelchair bound).   PT Comments    Pt requiring maxA (+2 safety) for pivot transfer to recliner. Likely near baseline mobility, requiring assist for w/c transfers at ALF. Pt remains pleasantly confused. Pt has no further acute PT needs, will sign off as pt plans to return to ALF with hospice. Please reconsult if new needs arise. Thank you for this referral.   Follow Up Recommendations  SNF(return to ALF with hospice)     Equipment Recommendations  None recommended by PT    Recommendations for Other Services       Precautions / Restrictions Precautions Precautions: Fall Restrictions Weight Bearing Restrictions: No    Mobility  Bed Mobility Overal bed mobility: Needs Assistance Bed Mobility: Supine to Sit     Supine to sit: Mod assist;HOB elevated     General bed mobility comments: ModA to assist trunk elevation and scoot hips to EOB  Transfers Overall transfer level: Needs assistance   Transfers: Squat Pivot Transfers     Squat pivot transfers: Max assist;+2 safety/equipment     General transfer comment: MaxA for squat pivot from bed to recliner, pt able to minimally assist with UE support; limited by generalized weakness and BLE contractures  Ambulation/Gait             General Gait Details: NT - does not ambulate at baseline   Stairs             Wheelchair Mobility    Modified Rankin (Stroke Patients Only)       Balance Overall balance assessment: Needs assistance   Sitting balance-Leahy Scale: Fair       Standing balance-Leahy Scale: Zero                               Cognition Arousal/Alertness: Awake/alert Behavior During Therapy: WFL for tasks assessed/performed Overall Cognitive Status: History of cognitive impairments - at baseline                                 General Comments: Very pleasantly confused. Pt poor historian, able to state name. Intermittent appropriate conversation; following some simple commands appropriately.       Exercises      General Comments        Pertinent Vitals/Pain Pain Assessment: Faces Faces Pain Scale: Hurts a little bit Pain Location: Generalized Pain Descriptors / Indicators: Discomfort Pain Intervention(s): Monitored during session;Repositioned    Home Living                      Prior Function            PT Goals (current goals can now be found in the care plan section) Acute Rehab PT Goals PT Goal Formulation: All assessment and education complete, DC therapy    Frequency           PT Plan Current plan remains appropriate    Co-evaluation              AM-PAC PT "6 Clicks" Mobility  Outcome Measure  Help needed turning from your back to your side while in a flat bed without using bedrails?: A Lot Help needed moving from lying on your back to sitting on the side of a flat bed without using bedrails?: A Lot Help needed moving to and from a bed to a chair (including a wheelchair)?: A Lot Help needed standing up from a chair using your arms (e.g., wheelchair or bedside chair)?: Total Help needed to walk in hospital room?: Total Help needed climbing 3-5 steps with a railing? : Total 6 Click Score: 9    End of Session Equipment Utilized During Treatment: Gait belt Activity Tolerance: Patient tolerated treatment well Patient left: in chair;with call bell/phone within reach;with chair alarm set Nurse Communication: Mobility status PT Visit Diagnosis: Other abnormalities of gait and mobility (R26.89);Muscle weakness (generalized)  (M62.81)     Time: 4034-7425 PT Time Calculation (min) (ACUTE ONLY): 14 min  Charges:  $Therapeutic Activity: 8-22 mins                    Mabeline Caras, PT, DPT Acute Rehabilitation Services  Pager [phone] Office [place] 05/21/2019, 9:59 AM

## 2019-05-21 NOTE — Progress Notes (Signed)
Patient being discharged to ALF today. See discharge summary.  No changes in patients condition.    Dyanne Carrel, NP

## 2019-05-21 NOTE — Social Work (Signed)
Clinical Social Worker facilitated patient discharge including contacting patient family and facility to confirm patient discharge plans.  Clinical information faxed to facility and family agreeable with plan.  CSW arranged ambulance transport via [facility] to [facility] at 2:30pm.  RN to call [phone]  with report prior to discharge.  Clinical Social Worker will sign off for now as social work intervention is no longer needed. Please consult [place] again if new need arises.  Westley Hummer, MSW, [place] Social Worker [phone]

## 2019-05-23 LAB — CULTURE, BLOOD (ROUTINE X 2)
Culture: NO GROWTH
Culture: NO GROWTH

## 2019-08-01 DEATH — deceased
